# Patient Record
Sex: Female | Born: 1960 | Race: White | Hispanic: No | Smoking: Never smoker
Health system: Southern US, Community
[De-identification: ages and names within clinical notes are randomized; demographics above are authoritative.]

## PROBLEM LIST (undated history)

## (undated) DIAGNOSIS — I10 Essential (primary) hypertension: Secondary | ICD-10-CM

## (undated) DIAGNOSIS — M069 Rheumatoid arthritis, unspecified: Secondary | ICD-10-CM

## (undated) HISTORY — PX: ABDOMINAL HYSTERECTOMY: SHX81

## (undated) HISTORY — PX: CHOLECYSTECTOMY: SHX55

## (undated) HISTORY — PX: TONSILLECTOMY: SUR1361

---

## 2017-09-04 ENCOUNTER — Other Ambulatory Visit: Payer: Self-pay | Admitting: Rheumatology

## 2017-09-04 DIAGNOSIS — J849 Interstitial pulmonary disease, unspecified: Secondary | ICD-10-CM

## 2017-09-23 ENCOUNTER — Ambulatory Visit
Admission: RE | Admit: 2017-09-23 | Discharge: 2017-09-23 | Disposition: A | Discharge status: Home / Self Care | Payer: Self-pay | Source: Ambulatory Visit | Attending: Rheumatology | Admitting: Rheumatology

## 2017-09-23 DIAGNOSIS — J849 Interstitial pulmonary disease, unspecified: Secondary | ICD-10-CM

## 2017-09-30 ENCOUNTER — Other Ambulatory Visit (HOSPITAL_COMMUNITY): Payer: Self-pay | Admitting: Respiratory Therapy

## 2017-09-30 DIAGNOSIS — J849 Interstitial pulmonary disease, unspecified: Secondary | ICD-10-CM

## 2017-10-09 ENCOUNTER — Ambulatory Visit (HOSPITAL_COMMUNITY)
Admission: RE | Admit: 2017-10-09 | Discharge: 2017-10-09 | Disposition: A | Discharge status: Home / Self Care | Payer: BLUE CROSS/BLUE SHIELD | Source: Ambulatory Visit | Attending: Rheumatology | Admitting: Rheumatology

## 2017-10-09 DIAGNOSIS — J849 Interstitial pulmonary disease, unspecified: Secondary | ICD-10-CM | POA: Diagnosis present

## 2017-10-09 DIAGNOSIS — J984 Other disorders of lung: Secondary | ICD-10-CM | POA: Insufficient documentation

## 2017-10-09 LAB — PULMONARY FUNCTION TEST
DL/VA % pred: 84 %
DL/VA: 4.14 ml/min/mmHg/L
DLCO unc % pred: 71 %
DLCO unc: 18.39 ml/min/mmHg
FEF 25-75 PRE: 3.14 L/s
FEF2575-%PRED-PRE: 123 %
FEV1-%PRED-PRE: 95 %
FEV1-PRE: 2.61 L
FEV1FVC-%Pred-Pre: 105 %
FEV6-%PRED-PRE: 91 %
FEV6-Pre: 3.11 L
FEV6FVC-%Pred-Pre: 102 %
FVC-%Pred-Pre: 89 %
FVC-Pre: 3.13 L
Pre FEV1/FVC ratio: 83 %
Pre FEV6/FVC Ratio: 99 %
RV % PRED: 93 %
RV: 1.84 L
TLC % pred: 97 %
TLC: 5.09 L

## 2018-03-19 ENCOUNTER — Inpatient Hospital Stay (HOSPITAL_COMMUNITY)
Admission: EM | Admit: 2018-03-19 | Discharge: 2018-03-21 | DRG: 683 | Disposition: A | Discharge status: Home / Self Care | Payer: BLUE CROSS/BLUE SHIELD | Attending: Family Medicine | Admitting: Family Medicine

## 2018-03-19 ENCOUNTER — Encounter (HOSPITAL_COMMUNITY): Payer: Self-pay | Admitting: Emergency Medicine

## 2018-03-19 DIAGNOSIS — Z791 Long term (current) use of non-steroidal anti-inflammatories (NSAID): Secondary | ICD-10-CM

## 2018-03-19 DIAGNOSIS — E876 Hypokalemia: Secondary | ICD-10-CM

## 2018-03-19 DIAGNOSIS — R112 Nausea with vomiting, unspecified: Secondary | ICD-10-CM | POA: Diagnosis present

## 2018-03-19 DIAGNOSIS — Z79899 Other long term (current) drug therapy: Secondary | ICD-10-CM

## 2018-03-19 DIAGNOSIS — N179 Acute kidney failure, unspecified: Secondary | ICD-10-CM | POA: Diagnosis not present

## 2018-03-19 DIAGNOSIS — M069 Rheumatoid arthritis, unspecified: Secondary | ICD-10-CM | POA: Diagnosis present

## 2018-03-19 DIAGNOSIS — R1013 Epigastric pain: Secondary | ICD-10-CM

## 2018-03-19 DIAGNOSIS — Z9049 Acquired absence of other specified parts of digestive tract: Secondary | ICD-10-CM

## 2018-03-19 DIAGNOSIS — I10 Essential (primary) hypertension: Secondary | ICD-10-CM | POA: Diagnosis present

## 2018-03-19 DIAGNOSIS — R197 Diarrhea, unspecified: Secondary | ICD-10-CM | POA: Diagnosis present

## 2018-03-19 DIAGNOSIS — E872 Acidosis: Secondary | ICD-10-CM | POA: Diagnosis present

## 2018-03-19 DIAGNOSIS — Z7952 Long term (current) use of systemic steroids: Secondary | ICD-10-CM

## 2018-03-19 DIAGNOSIS — D696 Thrombocytopenia, unspecified: Secondary | ICD-10-CM

## 2018-03-19 DIAGNOSIS — J111 Influenza due to unidentified influenza virus with other respiratory manifestations: Secondary | ICD-10-CM

## 2018-03-19 DIAGNOSIS — Z90710 Acquired absence of both cervix and uterus: Secondary | ICD-10-CM

## 2018-03-19 DIAGNOSIS — D649 Anemia, unspecified: Secondary | ICD-10-CM | POA: Diagnosis present

## 2018-03-19 DIAGNOSIS — E871 Hypo-osmolality and hyponatremia: Secondary | ICD-10-CM | POA: Diagnosis present

## 2018-03-19 HISTORY — DX: Essential (primary) hypertension: I10

## 2018-03-19 HISTORY — DX: Rheumatoid arthritis, unspecified: M06.9

## 2018-03-19 MED ORDER — ONDANSETRON 4 MG PO TBDP
4.0000 mg | ORAL_TABLET | Freq: Once | ORAL | Status: AC
  Administered 2018-03-19: 4 mg via ORAL
  Filled 2018-03-19: qty 1

## 2018-03-19 NOTE — ED Triage Notes (Signed)
Pt reports she was dx with the flu X2 days ago and was started on Tamiflu. Pt continues to have N/V/D

## 2018-03-20 ENCOUNTER — Encounter (HOSPITAL_COMMUNITY): Payer: Self-pay | Admitting: *Deleted

## 2018-03-20 ENCOUNTER — Emergency Department (HOSPITAL_COMMUNITY): Payer: BLUE CROSS/BLUE SHIELD

## 2018-03-20 ENCOUNTER — Other Ambulatory Visit: Payer: Self-pay

## 2018-03-20 DIAGNOSIS — N179 Acute kidney failure, unspecified: Secondary | ICD-10-CM | POA: Diagnosis present

## 2018-03-20 DIAGNOSIS — R112 Nausea with vomiting, unspecified: Secondary | ICD-10-CM | POA: Diagnosis present

## 2018-03-20 DIAGNOSIS — R197 Diarrhea, unspecified: Secondary | ICD-10-CM

## 2018-03-20 DIAGNOSIS — D649 Anemia, unspecified: Secondary | ICD-10-CM | POA: Diagnosis present

## 2018-03-20 DIAGNOSIS — E872 Acidosis: Secondary | ICD-10-CM | POA: Diagnosis present

## 2018-03-20 DIAGNOSIS — J111 Influenza due to unidentified influenza virus with other respiratory manifestations: Secondary | ICD-10-CM | POA: Diagnosis present

## 2018-03-20 DIAGNOSIS — Z9049 Acquired absence of other specified parts of digestive tract: Secondary | ICD-10-CM | POA: Diagnosis not present

## 2018-03-20 DIAGNOSIS — Z79899 Other long term (current) drug therapy: Secondary | ICD-10-CM | POA: Diagnosis not present

## 2018-03-20 DIAGNOSIS — D696 Thrombocytopenia, unspecified: Secondary | ICD-10-CM | POA: Diagnosis present

## 2018-03-20 DIAGNOSIS — Z791 Long term (current) use of non-steroidal anti-inflammatories (NSAID): Secondary | ICD-10-CM | POA: Diagnosis not present

## 2018-03-20 DIAGNOSIS — Z7952 Long term (current) use of systemic steroids: Secondary | ICD-10-CM | POA: Diagnosis not present

## 2018-03-20 DIAGNOSIS — E871 Hypo-osmolality and hyponatremia: Secondary | ICD-10-CM | POA: Diagnosis present

## 2018-03-20 DIAGNOSIS — I10 Essential (primary) hypertension: Secondary | ICD-10-CM | POA: Diagnosis present

## 2018-03-20 DIAGNOSIS — Z90710 Acquired absence of both cervix and uterus: Secondary | ICD-10-CM | POA: Diagnosis not present

## 2018-03-20 DIAGNOSIS — M069 Rheumatoid arthritis, unspecified: Secondary | ICD-10-CM | POA: Diagnosis present

## 2018-03-20 DIAGNOSIS — E876 Hypokalemia: Secondary | ICD-10-CM | POA: Diagnosis present

## 2018-03-20 LAB — CBC
HCT: 38.7 % (ref 36.0–46.0)
HEMATOCRIT: 30.9 % — AB (ref 36.0–46.0)
Hemoglobin: 12.9 g/dL (ref 12.0–15.0)
Hemoglobin: 10.3 g/dL — ABNORMAL LOW (ref 12.0–15.0)
MCH: 29.4 pg (ref 26.0–34.0)
MCH: 29.8 pg (ref 26.0–34.0)
MCHC: 33.3 g/dL (ref 30.0–36.0)
MCHC: 33.3 g/dL (ref 30.0–36.0)
MCV: 88.2 fL (ref 80.0–100.0)
MCV: 89.3 fL (ref 80.0–100.0)
NRBC: 0 % (ref 0.0–0.2)
NRBC: 0 % (ref 0.0–0.2)
PLATELETS: 223 10*3/uL (ref 150–400)
PLATELETS: 143 10*3/uL — AB (ref 150–400)
RBC: 4.39 MIL/uL (ref 3.87–5.11)
RBC: 3.46 MIL/uL — ABNORMAL LOW (ref 3.87–5.11)
RDW: 12.4 % (ref 11.5–15.5)
RDW: 12.5 % (ref 11.5–15.5)
WBC: 6.6 10*3/uL (ref 4.0–10.5)
WBC: 5.2 10*3/uL (ref 4.0–10.5)

## 2018-03-20 LAB — URINALYSIS, ROUTINE W REFLEX MICROSCOPIC
BILIRUBIN URINE: NEGATIVE
Glucose, UA: NEGATIVE mg/dL
KETONES UR: 5 mg/dL — AB
LEUKOCYTES UA: NEGATIVE
Nitrite: NEGATIVE
PH: 5 (ref 5.0–8.0)
PROTEIN: NEGATIVE mg/dL
Specific Gravity, Urine: 1.004 — ABNORMAL LOW (ref 1.005–1.030)

## 2018-03-20 LAB — BASIC METABOLIC PANEL
ANION GAP: 7 (ref 5–15)
Anion gap: 11 (ref 5–15)
BUN: 35 mg/dL — ABNORMAL HIGH (ref 6–20)
BUN: 26 mg/dL — ABNORMAL HIGH (ref 6–20)
CALCIUM: 8 mg/dL — AB (ref 8.9–10.3)
CO2: 17 mmol/L — ABNORMAL LOW (ref 22–32)
CO2: 21 mmol/L — ABNORMAL LOW (ref 22–32)
CREATININE: 2.08 mg/dL — AB (ref 0.44–1.00)
Calcium: 7.2 mg/dL — ABNORMAL LOW (ref 8.9–10.3)
Chloride: 106 mmol/L (ref 98–111)
Chloride: 112 mmol/L — ABNORMAL HIGH (ref 98–111)
Creatinine, Ser: 3.52 mg/dL — ABNORMAL HIGH (ref 0.44–1.00)
GFR calc Af Amer: 16 mL/min — ABNORMAL LOW (ref >60)
GFR calc non Af Amer: 13 mL/min — ABNORMAL LOW (ref >60)
GFR, EST AFRICAN AMERICAN: 29 mL/min — AB (ref >60)
GFR, EST NON AFRICAN AMERICAN: 25 mL/min — AB (ref >60)
Glucose, Bld: 82 mg/dL (ref 70–99)
Glucose, Bld: 129 mg/dL — ABNORMAL HIGH (ref 70–99)
Potassium: 3.1 mmol/L — ABNORMAL LOW (ref 3.5–5.1)
Potassium: 3.2 mmol/L — ABNORMAL LOW (ref 3.5–5.1)
Sodium: 134 mmol/L — ABNORMAL LOW (ref 135–145)
Sodium: 140 mmol/L (ref 135–145)

## 2018-03-20 LAB — I-STAT BETA HCG BLOOD, ED (MC, WL, AP ONLY): I-stat hCG, quantitative: 9.4 m[IU]/mL — ABNORMAL HIGH (ref <5)

## 2018-03-20 LAB — COMPREHENSIVE METABOLIC PANEL
ALBUMIN: 3.9 g/dL (ref 3.5–5.0)
ALT: 27 U/L (ref 0–44)
AST: 23 U/L (ref 15–41)
Alkaline Phosphatase: 42 U/L (ref 38–126)
Anion gap: 16 — ABNORMAL HIGH (ref 5–15)
BILIRUBIN TOTAL: 0.8 mg/dL (ref 0.3–1.2)
BUN: 39 mg/dL — AB (ref 6–20)
CO2: 19 mmol/L — ABNORMAL LOW (ref 22–32)
CREATININE: 3.92 mg/dL — AB (ref 0.44–1.00)
Calcium: 8.6 mg/dL — ABNORMAL LOW (ref 8.9–10.3)
Chloride: 97 mmol/L — ABNORMAL LOW (ref 98–111)
GFR calc Af Amer: 14 mL/min — ABNORMAL LOW (ref >60)
GFR calc non Af Amer: 12 mL/min — ABNORMAL LOW (ref >60)
GLUCOSE: 97 mg/dL (ref 70–99)
POTASSIUM: 2.8 mmol/L — AB (ref 3.5–5.1)
Sodium: 132 mmol/L — ABNORMAL LOW (ref 135–145)
TOTAL PROTEIN: 6.8 g/dL (ref 6.5–8.1)

## 2018-03-20 LAB — HIV ANTIBODY (ROUTINE TESTING W REFLEX): HIV Screen 4th Generation wRfx: NONREACTIVE

## 2018-03-20 LAB — MAGNESIUM: MAGNESIUM: 1.7 mg/dL (ref 1.7–2.4)

## 2018-03-20 LAB — LIPASE, BLOOD: Lipase: 38 U/L (ref 11–51)

## 2018-03-20 MED ORDER — SODIUM CHLORIDE 0.9 % IV BOLUS
1000.0000 mL | Freq: Once | INTRAVENOUS | Status: AC
  Administered 2018-03-20: 1000 mL via INTRAVENOUS

## 2018-03-20 MED ORDER — ACETAMINOPHEN 500 MG PO TABS
1000.0000 mg | ORAL_TABLET | Freq: Once | ORAL | Status: AC
  Administered 2018-03-20: 1000 mg via ORAL
  Filled 2018-03-20: qty 2

## 2018-03-20 MED ORDER — SODIUM CHLORIDE 0.45 % IV SOLN
INTRAVENOUS | Status: DC
  Administered 2018-03-20 (×2): via INTRAVENOUS
  Filled 2018-03-20 (×5): qty 75

## 2018-03-20 MED ORDER — FENTANYL CITRATE (PF) 100 MCG/2ML IJ SOLN
50.0000 ug | Freq: Once | INTRAMUSCULAR | Status: AC
  Administered 2018-03-20: 50 ug via INTRAVENOUS
  Filled 2018-03-20: qty 2

## 2018-03-20 MED ORDER — ONDANSETRON HCL 4 MG PO TABS: 4.0000 mg | ORAL_TABLET | Freq: Four times a day (QID) | ORAL | Status: DC | PRN

## 2018-03-20 MED ORDER — POTASSIUM CHLORIDE 10 MEQ/100ML IV SOLN
10.0000 meq | Freq: Once | INTRAVENOUS | Status: AC
  Administered 2018-03-20: 10 meq via INTRAVENOUS
  Filled 2018-03-20: qty 100

## 2018-03-20 MED ORDER — POTASSIUM CHLORIDE CRYS ER 20 MEQ PO TBCR: 40.0000 meq | EXTENDED_RELEASE_TABLET | Freq: Once | ORAL | Status: DC

## 2018-03-20 MED ORDER — PROMETHAZINE HCL 25 MG PO TABS: 12.5000 mg | ORAL_TABLET | Freq: Four times a day (QID) | ORAL | Status: DC | PRN

## 2018-03-20 MED ORDER — OSELTAMIVIR PHOSPHATE 30 MG PO CAPS
30.0000 mg | ORAL_CAPSULE | Freq: Every day | ORAL | Status: DC
  Administered 2018-03-20 – 2018-03-21 (×2): 30 mg via ORAL
  Filled 2018-03-20 (×2): qty 1

## 2018-03-20 MED ORDER — ACETAMINOPHEN 325 MG PO TABS
650.0000 mg | ORAL_TABLET | Freq: Four times a day (QID) | ORAL | Status: DC | PRN
  Administered 2018-03-21: 650 mg via ORAL
  Filled 2018-03-20: qty 2

## 2018-03-20 MED ORDER — FOLIC ACID 1 MG PO TABS
1.0000 mg | ORAL_TABLET | Freq: Every day | ORAL | Status: DC
  Administered 2018-03-20 – 2018-03-21 (×2): 1 mg via ORAL
  Filled 2018-03-20 (×2): qty 1

## 2018-03-20 MED ORDER — HEPARIN SODIUM (PORCINE) 5000 UNIT/ML IJ SOLN: 5000.0000 [IU] | Freq: Three times a day (TID) | INTRAMUSCULAR | Status: DC

## 2018-03-20 MED ORDER — POTASSIUM CHLORIDE CRYS ER 20 MEQ PO TBCR: 20.0000 meq | EXTENDED_RELEASE_TABLET | Freq: Every day | ORAL | Status: DC

## 2018-03-20 MED ORDER — POTASSIUM CHLORIDE CRYS ER 20 MEQ PO TBCR
20.0000 meq | EXTENDED_RELEASE_TABLET | Freq: Once | ORAL | Status: AC
  Administered 2018-03-20: 20 meq via ORAL

## 2018-03-20 MED ORDER — POTASSIUM CHLORIDE CRYS ER 20 MEQ PO TBCR
30.0000 meq | EXTENDED_RELEASE_TABLET | Freq: Once | ORAL | Status: AC
  Administered 2018-03-20: 30 meq via ORAL
  Filled 2018-03-20: qty 1

## 2018-03-20 MED ORDER — ONDANSETRON HCL 4 MG/2ML IJ SOLN: 4.0000 mg | Freq: Four times a day (QID) | INTRAMUSCULAR | Status: DC | PRN

## 2018-03-20 MED ORDER — ACETAMINOPHEN 650 MG RE SUPP: 650.0000 mg | Freq: Four times a day (QID) | RECTAL | Status: DC | PRN

## 2018-03-20 MED ORDER — POTASSIUM CHLORIDE CRYS ER 20 MEQ PO TBCR
40.0000 meq | EXTENDED_RELEASE_TABLET | Freq: Once | ORAL | Status: AC
  Administered 2018-03-20: 40 meq via ORAL
  Filled 2018-03-20: qty 2

## 2018-03-20 MED ORDER — ONDANSETRON HCL 4 MG/2ML IJ SOLN
4.0000 mg | Freq: Once | INTRAMUSCULAR | Status: AC
  Administered 2018-03-20: 4 mg via INTRAVENOUS
  Filled 2018-03-20: qty 2

## 2018-03-20 MED ORDER — POTASSIUM CHLORIDE CRYS ER 20 MEQ PO TBCR
20.0000 meq | EXTENDED_RELEASE_TABLET | Freq: Once | ORAL | Status: AC
  Administered 2018-03-20: 20 meq via ORAL
  Filled 2018-03-20: qty 1

## 2018-03-20 MED ORDER — PREDNISONE 5 MG PO TABS
15.0000 mg | ORAL_TABLET | Freq: Every day | ORAL | Status: DC
  Administered 2018-03-20 – 2018-03-21 (×2): 15 mg via ORAL
  Filled 2018-03-20 (×2): qty 1

## 2018-03-20 MED ORDER — SODIUM CHLORIDE 0.9 % IV SOLN
INTRAVENOUS | Status: DC
  Administered 2018-03-20: 03:00:00 via INTRAVENOUS

## 2018-03-20 MED ORDER — POTASSIUM CHLORIDE CRYS ER 10 MEQ PO TBCR
EXTENDED_RELEASE_TABLET | ORAL | Status: AC
  Filled 2018-03-20: qty 1

## 2018-03-20 NOTE — ED Provider Notes (Signed)
[facility] EMERGENCY DEPARTMENT Provider Note  CSN: [id number] Arrival date & time: 03/19/18 2247  Chief Complaint(s) Influenza  HPI Madison Barajas is a 57 y.o. female recently diagnosed with influenza currently  The history is provided by the patient.  Influenza  Presenting symptoms: diarrhea, fatigue, fever, headache (intermittent, frontal), myalgias, nausea and vomiting   Presenting symptoms: no cough, no rhinorrhea, no shortness of breath and no sore throat   Severity:  Moderate Onset quality:  Gradual Duration:  5 days Progression:  Waxing and waning Chronicity:  New Relieved by:  Nothing Worsened by:  Nothing Ineffective treatments:  OTC medications (and Zofran or Tamiflu) Associated symptoms: chills   Risk factors: immunocompromised state (h/o RA on MTX and prednisone)   Risk factors: no kidney disease and no liver disease     Past Medical History Past Medical History:  Diagnosis Date  . Hypertension   . Rheumatoid arthritis [facility])    Patient Active Problem List   Diagnosis Date Noted  . Acute kidney failure (HCC) 03/20/2018   Home Medication(s) Prior to Admission medications   Medication Sig Start Date End Date Taking? Authorizing Provider  eszopiclone (LUNESTA) 2 MG TABS tablet Take 2 mg by mouth at bedtime.  02/20/18  Yes [provider]  folic acid (FOLVITE) 1 MG tablet Take 1 mg by mouth daily. 02/19/18  Yes [provider]  ibuprofen (ADVIL,MOTRIN) 800 MG tablet Take 800 mg by mouth every 8 (eight) hours as needed for mild pain.  03/18/18  Yes [provider]  methotrexate 2.5 MG tablet Take 15 mg by mouth once a week. On Friday 02/26/18  Yes [provider]  ondansetron (ZOFRAN) 8 MG tablet Take 8 mg by mouth every 8 (eight) hours as needed for nausea. for nausea 03/18/18  Yes [provider]  oseltamivir (TAMIFLU) 75 MG capsule Take 75 mg by mouth daily. 03/18/18  Yes [provider]   predniSONE (DELTASONE) 5 MG tablet Take 5 mg by mouth daily. 02/23/18  Yes [provider]  telmisartan-hydrochlorothiazide (MICARDIS HCT) 40-12.5 MG tablet Take 1 tablet by mouth daily. 02/15/18  Yes [provider]                                                                                                                                    Past Surgical History Past Surgical History:  Procedure Laterality Date  . ABDOMINAL HYSTERECTOMY    . CHOLECYSTECTOMY    . TONSILLECTOMY     Family History No family history on file.  Social History Social History   Tobacco Use  . Smoking status: Never Smoker  . Smokeless tobacco: Never Used  Substance Use Topics  . Alcohol use: Not on file  . Drug use: Not Currently   Allergies Patient has no known allergies.  Review of Systems Review of Systems  Constitutional: Positive for chills, fatigue and fever.  HENT: Negative  for rhinorrhea and sore throat.   Respiratory: Negative for cough and shortness of breath.   Gastrointestinal: Positive for diarrhea, nausea and vomiting.  Musculoskeletal: Positive for myalgias.  Neurological: Positive for headaches (intermittent, frontal).   All other systems are reviewed and are negative for acute change except as noted in the HPI  Physical Exam Vital Signs  I have reviewed the triage vital signs BP 102/60 (BP Location: Left Arm)   Pulse 65   Temp 98 F (36.7 C) (Oral)   Resp 18   Ht 5\' 5"  (1.651 m)   Wt 63.5 kg   SpO2 99%   BMI 23.30 kg/m   Physical Exam  Constitutional: She is oriented to person, place, and time. She appears well-developed and well-nourished. She has a sickly appearance. No distress.  HENT:  Head: Normocephalic and atraumatic.  Nose: Nose normal.  Eyes: Pupils are equal, round, and reactive to light. Conjunctivae and EOM are normal. Right eye exhibits no discharge. Left eye exhibits no discharge. No scleral icterus.  Neck: Normal range of motion.  Neck supple.  Cardiovascular: Normal rate and regular rhythm. Exam reveals no gallop and no friction rub.  No murmur heard. Pulmonary/Chest: Effort normal and breath sounds normal. No stridor. No respiratory distress. She has no rales.  Abdominal: Soft. She exhibits no distension. There is tenderness in the epigastric area. There is no rigidity, no rebound, no guarding and no CVA tenderness.  Musculoskeletal: She exhibits no edema or tenderness.  Neurological: She is alert and oriented to person, place, and time.  Skin: Skin is warm and dry. No rash noted. She is not diaphoretic. No erythema.  Psychiatric: She has a normal mood and affect.  Vitals reviewed.   ED Results and Treatments Labs (all labs ordered are listed, but only abnormal results are displayed) Labs Reviewed  COMPREHENSIVE METABOLIC PANEL - Abnormal; Notable for the following components:      Result Value   Sodium 132 (*)    Potassium 2.8 (*)    Chloride 97 (*)    CO2 19 (*)    BUN 39 (*)    Creatinine, Ser 3.92 (*)    Calcium 8.6 (*)    GFR calc non Af Amer 12 (*)    GFR calc Af Amer 14 (*)    Anion gap 16 (*)    All other components within normal limits  I-STAT BETA HCG BLOOD, ED ([facility], [facility], [facility] ONLY) - Abnormal; Notable for the following components:   I-stat hCG, quantitative 9.4 (*)    All other components within normal limits  LIPASE, BLOOD  CBC  URINALYSIS, ROUTINE W REFLEX MICROSCOPIC  HIV ANTIBODY (ROUTINE TESTING W REFLEX)  CBC  BASIC METABOLIC PANEL                                                                                                                         EKG  EKG Interpretation  Date/Time:  Friday March 20 2018 01:46:17 EST Ventricular  Rate:  67 PR Interval:    QRS Duration: 116 QT Interval:  442 QTC Calculation: 467 R Axis:   68 Text Interpretation:  Sinus rhythm Nonspecific intraventricular conduction delay Low voltage, precordial leads NO STEMI No old tracing to compare  Confirmed by Drema Pry [phone]) on 03/20/2018 1:51:11 AM      Radiology Dg Chest 2 View  Result Date: 03/20/2018 CLINICAL DATA:  Diagnosed with flu 2 days ago. Nausea, vomiting, diarrhea, and body aches. History of hypertension. EXAM: CHEST - 2 VIEW COMPARISON:  August 29, 2016 FINDINGS: The heart size and mediastinal contours are within normal limits. Both lungs are clear. The visualized skeletal structures are unremarkable. IMPRESSION: No active cardiopulmonary disease. Electronically Signed   By: Gerome Sam III M.D   On: 03/20/2018 01:44   Pertinent labs & imaging results that were available during my care of the patient were reviewed by me and considered in my medical decision making (see chart for details).  Medications Ordered in ED Medications  potassium chloride 10 mEq in 100 mL IVPB (10 mEq Intravenous New Bag/Given 03/20/18 0150)  acetaminophen (TYLENOL) tablet 650 mg (has no administration in time range)    Or  acetaminophen (TYLENOL) suppository 650 mg (has no administration in time range)  ondansetron (ZOFRAN) tablet 4 mg (has no administration in time range)    Or  ondansetron (ZOFRAN) injection 4 mg (has no administration in time range)  predniSONE (DELTASONE) tablet 15 mg (has no administration in time range)  heparin injection 5,000 Units (has no administration in time range)  0.9 %  sodium chloride infusion (has no administration in time range)  promethazine (PHENERGAN) tablet 12.5 mg (has no administration in time range)  ondansetron (ZOFRAN-ODT) disintegrating tablet 4 mg (4 mg Oral Given 03/19/18 2325)  acetaminophen (TYLENOL) tablet 1,000 mg (1,000 mg Oral Given 03/20/18 0149)  sodium chloride 0.9 % bolus 1,000 mL (0 mLs Intravenous Stopped 03/20/18 0215)  ondansetron (ZOFRAN) injection 4 mg (4 mg Intravenous Given 03/20/18 0149)  fentaNYL (SUBLIMAZE) injection 50 mcg (50 mcg Intravenous Given 03/20/18 0148)                                                                                                                                     Procedures Procedures CRITICAL CARE Performed by: Amadeo Garnet Finola Rosal Total critical care time: 35 minutes Critical care time was exclusive of separately billable procedures and treating other patients. Critical care was necessary to treat or prevent imminent or life-threatening deterioration. Critical care was time spent personally by me on the following activities: development of treatment plan with patient and/or surrogate as well as nursing, discussions with consultants, evaluation of patient's response to treatment, examination of patient, obtaining history from patient or surrogate, ordering and performing treatments and interventions, ordering and review of laboratory studies, ordering and review of radiographic studies, pulse oximetry and re-evaluation of patient's condition.   (including critical care  time)  Medical Decision Making / ED Course I have reviewed the nursing notes for this encounter and the patient's prior records (if available in EHR or on provided paperwork).    Patient here for inability to tolerate oral intake due to influenza symptoms.  Work-up notable for AKI likely due to dehydration and medication.  With hypo-kalemia likely due to diarrhea.  Patient having epigastric abdominal pain and discomfort with no evidence of biliary obstruction or pancreatitis.  Likely due to gastric etiology from emesis.  No leukocytosis.  Doubt cardiac etiology.  Patient provided with symptomatic treatment including several IV fluid boluses, nausea medicine, Tylenol and IV pain medicine.  Admitted to medicine for continued management and hydration.  Final Clinical Impression(s) / ED Diagnoses Final diagnoses:  Epigastric abdominal pain  Influenza  AKI (acute kidney injury) (HCC)  Hypokalemia      This chart was dictated using voice recognition software.  Despite best efforts to proofread,  errors  can occur which can change the documentation meaning.   Nira Conn, MD 03/20/18 [phone]

## 2018-03-20 NOTE — H&P (Signed)
History and Physical    Madison Barajas GNF:[id number] DOB: [birth year] DOA: 03/19/2018  PCP: Renaye Rakers, MD  Patient coming from: Home  I have personally briefly reviewed patient's old medical records in [facility]  Chief Complaint: Influenza  HPI: Madison Barajas is a 57 y.o. female with medical history significant of RA, HTN.  Patient presents to the ED with c/o 5 days of ongoing N/V/D, fatigue, fever, headache, myalgias.  Diagnosed with influenza virus a couple of days ago and started on Tamiflu.  Symptoms have persisted.   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   Review of Systems: As per HPI otherwise 10 point review of systems negative.   Past Medical History:  Diagnosis Date  . Hypertension   . Rheumatoid arthritis [facility])     Past Surgical History:  Procedure Laterality Date  . ABDOMINAL HYSTERECTOMY    . CHOLECYSTECTOMY    . TONSILLECTOMY       reports that she has never smoked. She has never used smokeless tobacco. She reports that she has current or past drug history. Her alcohol history is not on file.  No Known Allergies  No family history on file.   Prior to Admission medications   Medication Sig Start Date End Date Taking? Authorizing Provider  eszopiclone (LUNESTA) 2 MG TABS tablet Take 2 mg by mouth at bedtime.  02/20/18  Yes [provider]  folic acid (FOLVITE) 1 MG tablet Take 1 mg by mouth daily. 02/19/18  Yes [provider]  ibuprofen (ADVIL,MOTRIN) 800 MG tablet Take 800 mg by mouth every 8 (eight) hours as needed for mild pain.  03/18/18  Yes [provider]  methotrexate 2.5 MG tablet Take 15 mg by mouth once a week. On Friday 02/26/18  Yes [provider]  ondansetron (ZOFRAN) 8 MG tablet Take 8 mg by mouth every 8 (eight) hours as needed for nausea. for nausea 03/18/18  Yes [provider]  oseltamivir (TAMIFLU) 75 MG capsule Take 75 mg by mouth daily. 03/18/18  Yes  [provider]  predniSONE (DELTASONE) 5 MG tablet Take 5 mg by mouth daily. 02/23/18  Yes [provider]  telmisartan-hydrochlorothiazide (MICARDIS HCT) 40-12.5 MG tablet Take 1 tablet by mouth daily. 02/15/18  Yes [provider]    Physical Exam: Vitals:   03/20/18 0104 03/20/18 0156 03/20/18 0200 03/20/18 0215  BP: 102/60 (!) 106/48 (!) 100/52 (!) 101/59  Pulse: 65 85 76 77  Resp: 18 16 12    Temp: 98 F (36.7 C)     TempSrc: Oral     SpO2: 99% 98% 99% 99%  Weight:      Height:        Constitutional: NAD, calm, comfortable, ill appearing Eyes: PERRL, lids and conjunctivae normal ENMT: Mucous membranes are moist. Posterior pharynx clear of any exudate or lesions.Normal dentition.  Neck: normal, supple, no masses, no thyromegaly Respiratory: clear to auscultation bilaterally, no wheezing, no crackles. Normal respiratory effort. No accessory muscle use.  Cardiovascular: Regular rate and rhythm, no murmurs / rubs / gallops. No extremity edema. 2+ pedal pulses. No carotid bruits.  Abdomen: Epigastric TTP Musculoskeletal: no clubbing / cyanosis. No joint deformity upper and lower extremities. Good ROM, no contractures. Normal muscle tone.  Skin: no rashes, lesions, ulcers. No induration Neurologic: CN 2-12 grossly intact. Sensation intact, DTR normal. Strength 5/5 in all 4.  Psychiatric: Normal judgment and insight. Alert and oriented x 3. Normal  mood.    Labs on Admission: I have personally reviewed following labs and imaging studies  CBC: Recent Labs  Lab 03/19/18 2326  WBC 6.6  HGB 12.9  HCT 38.7  MCV 88.2  PLT 223   Basic Metabolic Panel: Recent Labs  Lab 03/19/18 2326  NA 132*  K 2.8*  CL 97*  CO2 19*  GLUCOSE 97  BUN 39*  CREATININE 3.92*  CALCIUM 8.6*   GFR: Estimated Creatinine Clearance: 14.2 mL/min (A) (by C-G formula based on SCr of 3.92 mg/dL (H)). Liver Function Tests: Recent Labs  Lab 03/19/18 2326  AST 23  ALT 27   ALKPHOS 42  BILITOT 0.8  PROT 6.8  ALBUMIN 3.9   Recent Labs  Lab 03/19/18 2326  LIPASE 38   No results for input(s): AMMONIA in the last 168 hours. Coagulation Profile: No results for input(s): INR, PROTIME in the last 168 hours. Cardiac Enzymes: No results for input(s): CKTOTAL, CKMB, CKMBINDEX, TROPONINI in the last 168 hours. BNP (last 3 results) No results for input(s): PROBNP in the last 8760 hours. HbA1C: No results for input(s): HGBA1C in the last 72 hours. CBG: No results for input(s): GLUCAP in the last 168 hours. Lipid Profile: No results for input(s): CHOL, HDL, LDLCALC, TRIG, CHOLHDL, LDLDIRECT in the last 72 hours. Thyroid Function Tests: No results for input(s): TSH, T4TOTAL, FREET4, T3FREE, THYROIDAB in the last 72 hours. Anemia Panel: No results for input(s): VITAMINB12, FOLATE, FERRITIN, TIBC, IRON, RETICCTPCT in the last 72 hours. Urine analysis: No results found for: COLORURINE, APPEARANCEUR, LABSPEC, PHURINE, GLUCOSEU, HGBUR, BILIRUBINUR, KETONESUR, PROTEINUR, UROBILINOGEN, NITRITE, LEUKOCYTESUR  Radiological Exams on Admission: Dg Chest 2 View  Result Date: 03/20/2018 CLINICAL DATA:  Diagnosed with flu 2 days ago. Nausea, vomiting, diarrhea, and body aches. History of hypertension. EXAM: CHEST - 2 VIEW COMPARISON:  August 29, 2016 FINDINGS: The heart size and mediastinal contours are within normal limits. Both lungs are clear. The visualized skeletal structures are unremarkable. IMPRESSION: No active cardiopulmonary disease. Electronically Signed   By: Gerome Sam III M.D   On: 03/20/2018 01:44    EKG: Independently reviewed.  Assessment/Plan Principal Problem:   Acute kidney failure (HCC) Active Problems:   RA (rheumatoid arthritis) (HCC)   Influenza   Nausea vomiting and diarrhea    1. AKF - 1. UA ordered and pending 2. Suspect pre-renal 3. Strict intake and output 4. Follow BMP 5. IVF: ns AT 100 cc/hr 1L bolus in ED. 6. Avoid  NSAIDS 7. Holding HCTZ - lisinopril 2. RA - 1. Holding off on MTX during acute illness 2. Trippling up on Prednisone dose during acute illness so 15mg  PO daily with first dose now ordered 3. Not hypotensive to suggest need for full stress dose at this point. 3. Influenza - cont tamiflu 4. N/V/D - 1. Zofran PRN 2. Phenergan PRN break through  DVT prophylaxis: Heparin [place] Code Status: Full Family Communication: No family in room Disposition Plan: Home after admit Consults called: None Admission status: Admit to inpatient  Severity of Illness: The appropriate patient status for this patient is INPATIENT. Inpatient status is judged to be reasonable and necessary in order to provide the required intensity of service to ensure the patient's safety. The patient's presenting symptoms, physical exam findings, and initial radiographic and laboratory data in the context of their chronic comorbidities is felt to place them at high risk for further clinical deterioration. Furthermore, it is not anticipated that the patient will be medically stable for discharge  from the hospital within 2 midnights of admission. The following factors support the patient status of inpatient.   " The patient's presenting symptoms include Influenza, N/V/D, fever, chills. 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 " The chronic co-morbidities include RA on MTX and prednisone.   * I certify that at the point of admission it is my clinical judgment that the patient will require inpatient hospital care spanning beyond 2 midnights from the point of admission due to high intensity of service, high risk for further deterioration and high frequency of surveillance required.Hillary Bow DO [facility] Pager [phone] Only works nights!  If 7AM-7PM, please contact the primary day team physician taking care of  patient  [email or web address] Password [email or web address]  03/20/2018, 2:41 AM

## 2018-03-20 NOTE — Progress Notes (Signed)
  Patient was admitted this morning, reviewed H&P and admission notes and agree with admission plan with following addendum.  The  Please review  Admission note for more details.  Briefly 57 year old female with history of RA on methotrexate/prednisone, hypertension, who was recently diagnosed with influenza, on Tamiflu ER with complaint of nausea, vomiting, diarrhea, generalized weakness fever headache myalgias of 5 days.  In the ER she was found to have acute renal failure with BUN/creatinine 39/3.9, hyponatremia, hypokalemia of 2.8, anion gap metabolic acidosis.  She will is afebrile, hemodynamically stable.  Patient was admitted for further management. This morning she feels much improved.  No nausea or vomiting, able to tolerate breakfast.  But is still having diarrhea.  Acute kidney injury w BUN/creatinine 39/3.9, suspecting prerenal from her nausea vomiting diarrhea: BUN/creatinine down to 35/3.5.,  Will continue on IV fluid hydration, changed to 0.45 NS with 19M EQ bicarb at 125 mL/hr, repeat BMP later today and in the morning monitor urine output.  If creatinine not improving and/or worsening may need to discuss with nephrology.  Replete the potassium w recheck in afternoon. Continue on renally adjusted dose of Tamiflu. Continue on steroid.BP soft we will continue aggressive IV fluid hydration. Steroids dose has been increased x3 to 15 mg daily.

## 2018-03-21 DIAGNOSIS — J111 Influenza due to unidentified influenza virus with other respiratory manifestations: Secondary | ICD-10-CM

## 2018-03-21 DIAGNOSIS — D696 Thrombocytopenia, unspecified: Secondary | ICD-10-CM

## 2018-03-21 DIAGNOSIS — N179 Acute kidney failure, unspecified: Principal | ICD-10-CM

## 2018-03-21 DIAGNOSIS — R197 Diarrhea, unspecified: Secondary | ICD-10-CM

## 2018-03-21 DIAGNOSIS — R112 Nausea with vomiting, unspecified: Secondary | ICD-10-CM

## 2018-03-21 LAB — BASIC METABOLIC PANEL
ANION GAP: 8 (ref 5–15)
BUN: 13 mg/dL (ref 6–20)
CALCIUM: 8 mg/dL — AB (ref 8.9–10.3)
CO2: 26 mmol/L (ref 22–32)
Chloride: 109 mmol/L (ref 98–111)
Creatinine, Ser: 1.13 mg/dL — ABNORMAL HIGH (ref 0.44–1.00)
GFR, EST NON AFRICAN AMERICAN: 53 mL/min — AB (ref >60)
Glucose, Bld: 93 mg/dL (ref 70–99)
Potassium: 2.9 mmol/L — ABNORMAL LOW (ref 3.5–5.1)
Sodium: 143 mmol/L (ref 135–145)

## 2018-03-21 LAB — CBC
HCT: 29.7 % — ABNORMAL LOW (ref 36.0–46.0)
HEMOGLOBIN: 9.7 g/dL — AB (ref 12.0–15.0)
MCH: 29 pg (ref 26.0–34.0)
MCHC: 32.7 g/dL (ref 30.0–36.0)
MCV: 88.9 fL (ref 80.0–100.0)
NRBC: 0 % (ref 0.0–0.2)
PLATELETS: 205 10*3/uL (ref 150–400)
RBC: 3.34 MIL/uL — AB (ref 3.87–5.11)
RDW: 12.6 % (ref 11.5–15.5)
WBC: 4 10*3/uL (ref 4.0–10.5)

## 2018-03-21 MED ORDER — POTASSIUM CHLORIDE CRYS ER 20 MEQ PO TBCR
40.0000 meq | EXTENDED_RELEASE_TABLET | ORAL | Status: DC
  Administered 2018-03-21 (×2): 40 meq via ORAL
  Filled 2018-03-21 (×2): qty 2

## 2018-03-21 MED ORDER — MAGNESIUM SULFATE 2 GM/50ML IV SOLN
2.0000 g | Freq: Once | INTRAVENOUS | Status: AC
  Administered 2018-03-21: 2 g via INTRAVENOUS
  Filled 2018-03-21: qty 50

## 2018-03-21 NOTE — Progress Notes (Signed)
  PROGRESS NOTE  Madison Barajas EXH:[id number] DOB: [birth year] DOA: 03/19/2018 PCP: Renaye Rakers, MD  Brief Narrative: 57 year old woman PMH rheumatoid arthritis presented with 5-day history of nausea, vomiting, diarrhea, fatigue, myalgias.  Diagnosed with influenza several days prior to presentation and started on Tamiflu.  Assessment/Plan Acute kidney injury, secondary to nausea, vomiting, diarrhea, complicated by ibuprofen, telmisartan, hydrochlorothiazide. --Acute kidney injury resolved.  Urine output adequate.  Renal function appears to be normal.  Nausea, vomiting, diarrhea with associated abdominal pain, likely influenza related. --Nausea and vomiting resolved.  Diarrhea has slowed down.  Abdominal pain has resolved. --Supportive care.  Hypokalemia --Replete.  Also replete magnesium.  Influenza dx as outpatient --Complete Tamiflu   Normocytic anemia --Recheck CBC today  Thrombocytopenia --Likely secondary to influenza.  Recheck CBC today  Rheumatoid arthritis on methotrexate and prednisone as an outpatient. --Continue prednisone.  Resume methotrexate as an outpatient.   Improved.  Advance to regular diet.  If tolerates anticipate discharge this afternoon.  DVT prophylaxis: heparin Code Status: Full Family Communication: none Disposition Plan: home    Brendia Sacks, MD  [facility] Direct contact: [phone] --Via amion app OR  --[email or web address]; password [email or web address]  7PM-7AM contact night coverage as above 03/21/2018, 8:44 AM  LOS: 1 day   Consultants:    Procedures:    Antimicrobials:  Tamiflu  Interval history/Subjective: Afebrile, vital signs stable. Feels a lot better, no nausea or vomiting.  Diarrhea has slowed down, perhaps 3 times per day.  Tolerating liquids.  Would like to try solid diet.  Objective: Vitals:  Vitals:   03/20/18 2254 03/21/18 0515  BP: 98/60 (!) 106/52  Pulse: 63 (!) 52  Resp: 16 16  Temp: 98.9 F (37.2 C) 98.2  F (36.8 C)  SpO2: 98% 99%    Exam:  Constitutional:  . Appears calm and comfortable Eyes:  . pupils and irises appear normal ENMT:  . grossly normal hearing  . Lips appear normal . Tongue appears unremarkable Respiratory:  . CTA bilaterally, no w/r/r.  . Respiratory effort normal.  Cardiovascular:  . RRR, no m/r/g . No LE extremity edema   Abdomen:  . Soft, nontender, nondistended Psychiatric:  . Mental status o Mood, affect appropriate  I have personally reviewed the following:   Data: . Urine output 700+ multiple voids . Creatinine 3.92, 3.52, 2.08, 1.13 . Potassium 2.9 magnesium borderline yesterday 1.7 . CBC.  Today pending.  Yesterday platelets were 143, hemoglobin 10.3. Marland Kitchen Urinalysis was equivocal . I-STAT hCG was modestly elevated.  Unclear why this was checked in a patient PMH hysterectomy and age 35.  Follow-up as an outpatient. . Chest x-ray independently reviewed, no acute disease . EKG nonacute  Scheduled Meds: . folic acid  1 mg Oral Daily  . heparin  5,000 Units Subcutaneous Q8H  . oseltamivir  30 mg Oral Daily  . potassium chloride  40 mEq Oral Q4H  . predniSONE  15 mg Oral Q breakfast   Continuous Infusions: . magnesium sulfate 1 - 4 g bolus IVPB      Principal Problem:   AKI (acute kidney injury) (HCC) Active Problems:   RA (rheumatoid arthritis) (HCC)   Influenza   Nausea vomiting and diarrhea   Thrombocytopenia (HCC)   LOS: 1 day

## 2018-03-21 NOTE — Discharge Summary (Signed)
Physician Discharge Summary  Madison Barajas XYI:[id number] DOB: 1960-07-01 DOA: 03/19/2018  PCP: Renaye Rakers, MD  Admit date: 03/19/2018 Discharge date: 03/21/2018  Recommendations for Outpatient Follow-up:   Normocytic anemia --Stable.  Follow-up as an outpatient.  Follow-up Information    Renaye Rakers, MD. Schedule an appointment as soon as possible for a visit in 2 week(s).   Specialty:  Family Medicine Contact information: [address] [place] [address] [phone]            Discharge Diagnoses:  1. Acute kidney injury PRINCIPAL 2. Nausea, vomiting, diarrhea with associated abdominal pain, Hypokalemia 3. Influenza dx as outpatient 4. Normocytic anemia 5. Thrombocytopenia 6. Rheumatoid arthritis   Discharge Condition: improved Disposition: home  Diet recommendation: heart healthy  Filed Weights   03/19/18 2321  Weight: 63.5 kg    History of present illness:  57 year old woman PMH rheumatoid arthritis presented with 5-day history of nausea, vomiting, diarrhea, fatigue, myalgias.  Diagnosed with influenza several days prior to presentation and started on Tamiflu.  Hospital Course:  Patient was treated with aggressive IV fluids, ibuprofen telmisartan and hydrochlorothiazide were held.  Condition rapidly improved and renal function returned to near normal.  Nausea, vomiting resolved.  Diarrhea manageable.  Tolerating diet.  Stable for discharge home.  Acute kidney injury, secondary to nausea, vomiting, diarrhea, complicated by ibuprofen, telmisartan, hydrochlorothiazide. --Acute kidney injury resolved.  Urine output adequate.  Renal function appears to be normal.  Nausea, vomiting, diarrhea with associated abdominal pain, likely influenza related. --Nausea and vomiting resolved.  Diarrhea has slowed down.  Abdominal pain has resolved. --Supportive care.  Hypokalemia --Replete.  Also replete magnesium.  Influenza dx as  outpatient --Complete Tamiflu   Normocytic anemia --Stable.  Follow-up as an outpatient.  Thrombocytopenia --Resolved.  Rheumatoid arthritis on methotrexate and prednisone as an outpatient. --Continue prednisone.  Resume methotrexate as an outpatient.  Today's assessment: See progress note same day  Discharge Instructions  Discharge Instructions    Activity as tolerated - No restrictions   Complete by:  As directed    Diet - low sodium heart healthy   Complete by:  As directed    Discharge instructions   Complete by:  As directed    Call your physician or seek immediate medical attention for pain, fever, vomiting, diarrhea, poor appetite, decreased urination or worsening of condition.     Allergies as of 03/21/2018   No Known Allergies     Medication List    STOP taking these medications   ibuprofen 800 MG tablet Commonly known as:  ADVIL,MOTRIN     TAKE these medications   eszopiclone 2 MG Tabs tablet Commonly known as:  LUNESTA Take 2 mg by mouth at bedtime.   folic acid 1 MG tablet Commonly known as:  FOLVITE Take 1 mg by mouth daily.   methotrexate 2.5 MG tablet Take 15 mg by mouth once a week. On Friday   ondansetron 8 MG tablet Commonly known as:  ZOFRAN Take 8 mg by mouth every 8 (eight) hours as needed for nausea. for nausea   oseltamivir 75 MG capsule Commonly known as:  TAMIFLU Take 75 mg by mouth daily.   predniSONE 5 MG tablet Commonly known as:  DELTASONE Take 5 mg by mouth daily.   telmisartan-hydrochlorothiazide 40-12.5 MG tablet Commonly known as:  MICARDIS HCT Take 1 tablet by mouth daily.      No Known Allergies  The results of significant diagnostics from this hospitalization (including imaging,  microbiology, ancillary and laboratory) are listed below for reference.    Significant Diagnostic Studies: Dg Chest 2 View  Result Date: 03/20/2018 CLINICAL DATA:  Diagnosed with flu 2 days ago. Nausea, vomiting, diarrhea, and  body aches. History of hypertension. EXAM: CHEST - 2 VIEW COMPARISON:  August 29, 2016 FINDINGS: The heart size and mediastinal contours are within normal limits. Both lungs are clear. The visualized skeletal structures are unremarkable. IMPRESSION: No active cardiopulmonary disease. Electronically Signed   By: Gerome Sam III M.D   On: 03/20/2018 01:44    Labs: Basic Metabolic Panel: Recent Labs  Lab 03/19/18 2326 03/20/18 0247 03/20/18 1418 03/21/18 0544  NA 132* 134* 140 143  K 2.8* 3.1* 3.2* 2.9*  CL 97* 106 112* 109  CO2 19* 17* 21* 26  GLUCOSE 97 82 129* 93  BUN 39* 35* 26* 13  CREATININE 3.92* 3.52* 2.08* 1.13*  CALCIUM 8.6* 7.2* 8.0* 8.0*  MG  --   --  1.7  --    Liver Function Tests: Recent Labs  Lab 03/19/18 2326  AST 23  ALT 27  ALKPHOS 42  BILITOT 0.8  PROT 6.8  ALBUMIN 3.9   Recent Labs  Lab 03/19/18 2326  LIPASE 38   CBC: Recent Labs  Lab 03/19/18 2326 03/20/18 0247 03/21/18 0848  WBC 6.6 5.2 4.0  HGB 12.9 10.3* 9.7*  HCT 38.7 30.9* 29.7*  MCV 88.2 89.3 88.9  PLT 223 143* 205    Principal Problem:   AKI (acute kidney injury) (HCC) Active Problems:   RA (rheumatoid arthritis) (HCC)   Influenza   Nausea vomiting and diarrhea   Thrombocytopenia (HCC)   Time coordinating discharge: 35 minutes  Signed:  Brendia Sacks, MD [facility] 03/21/2018, 2:27 PM

## 2019-08-07 ENCOUNTER — Ambulatory Visit: Payer: BLUE CROSS/BLUE SHIELD

## 2019-08-19 ENCOUNTER — Ambulatory Visit: Payer: BC Managed Care – PPO | Attending: Internal Medicine

## 2019-08-19 DIAGNOSIS — Z23 Encounter for immunization: Secondary | ICD-10-CM

## 2019-08-19 NOTE — Progress Notes (Signed)
   Covid-19 Vaccination Clinic  Name:  Madison Barajas    MRN: [id number] DOB: Aug 21, 1960  08/19/2019  [name] was observed post Covid-19 immunization for 15 minutes without incident. She was provided with Vaccine Information Sheet and instruction to access the V-Safe system.   [name] was instructed to call 911 with any severe reactions post vaccine: Marland Kitchen Difficulty breathing  . Swelling of face and throat  . A fast heartbeat  . A bad rash all over body  . Dizziness and weakness   Immunizations Administered    Name Date Dose VIS Date Route   Pfizer COVID-19 Vaccine 08/19/2019  4:07 PM 0.3 mL 04/16/2019 Intramuscular   Manufacturer: [employer], [employer]   Lot: [id number]   NDC: [address]-6

## 2019-09-20 ENCOUNTER — Ambulatory Visit: Payer: BC Managed Care – PPO | Attending: Internal Medicine

## 2020-02-09 IMAGING — CT CT CHEST HIGH RESOLUTION W/O CM
1 of 4 series · 14 of 33 positions shown, 18 images · non-contrast
Comparison: None.

CLINICAL DATA: Evaluate for interstitial lung disease.

EXAM:
CT CHEST WITHOUT CONTRAST
TECHNIQUE: Multidetector CT imaging of the chest was performed following the
standard protocol without intravenous contrast. High resolution
imaging of the lungs, as well as inspiratory and expiratory imaging,
was performed.

[Series 2: chest · axial · 0.66mm/px · z∈[-225,+3]mm · 14 of 128 slices shown, 18 images]
[im 7/128  mediastinal]
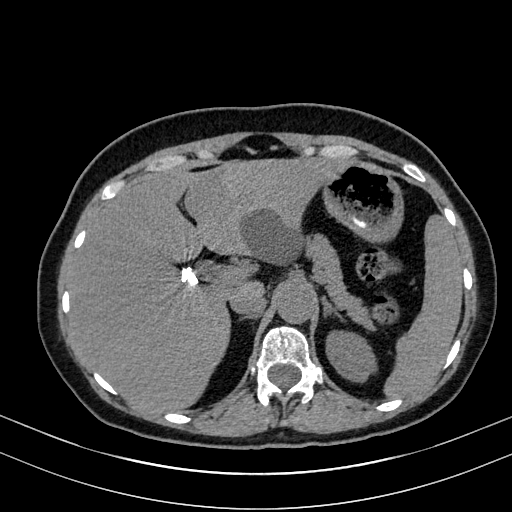
[im 7/128  lung]
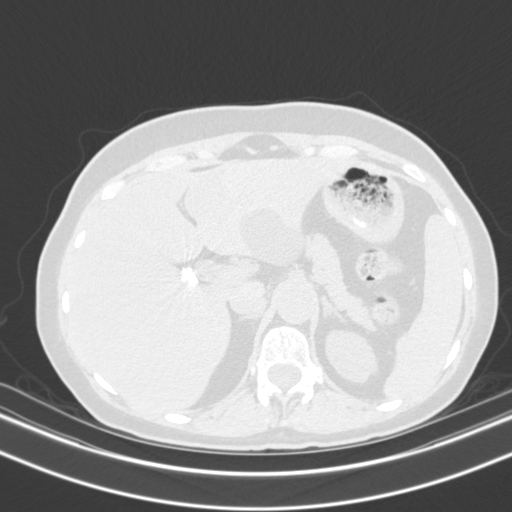
[im 19/128  lung]
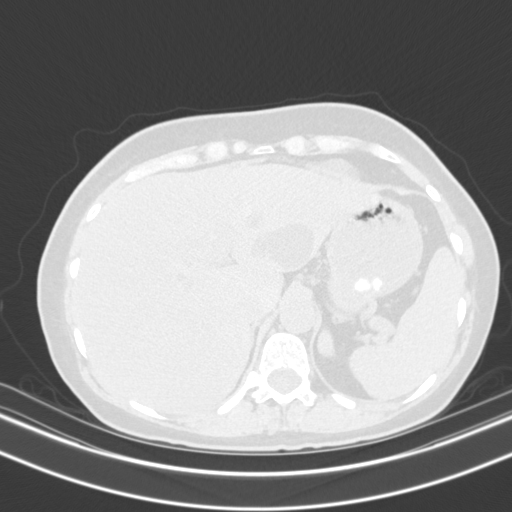
[im 25/128  lung]
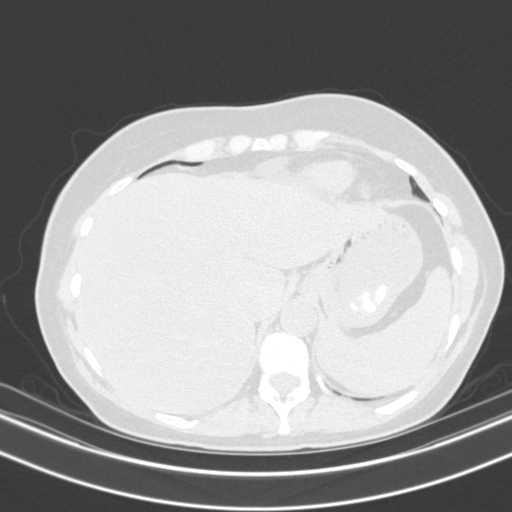
[im 37/128  lung]
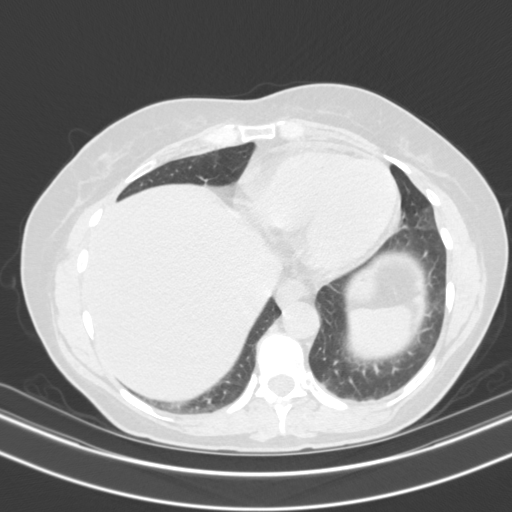
[im 49/128  mediastinal]
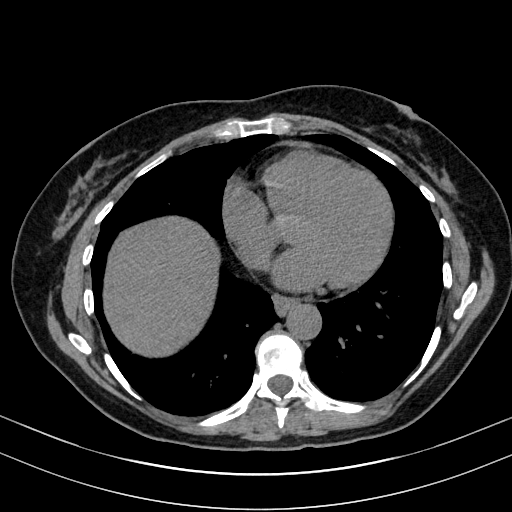
[im 49/128  lung]
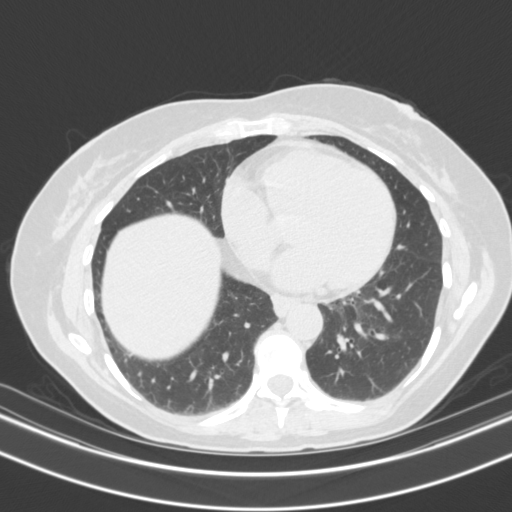
[im 55/128  lung]
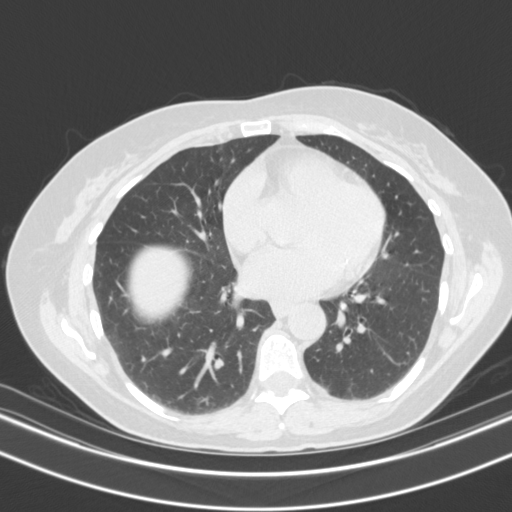
[im 61/128  lung]
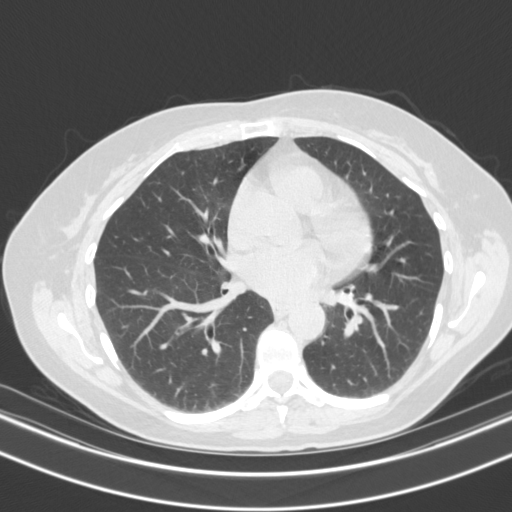
[im 64/128  lung]
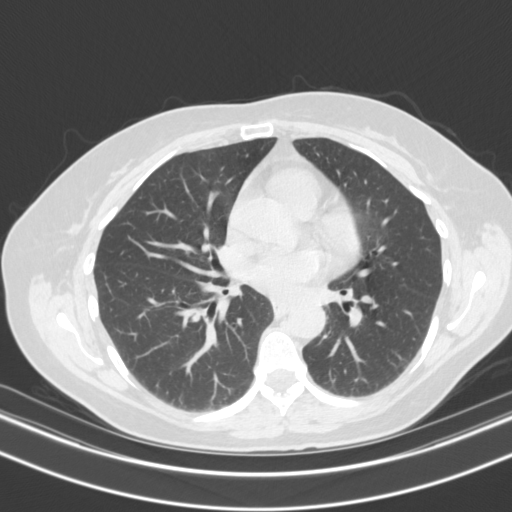
[im 73/128  mediastinal]
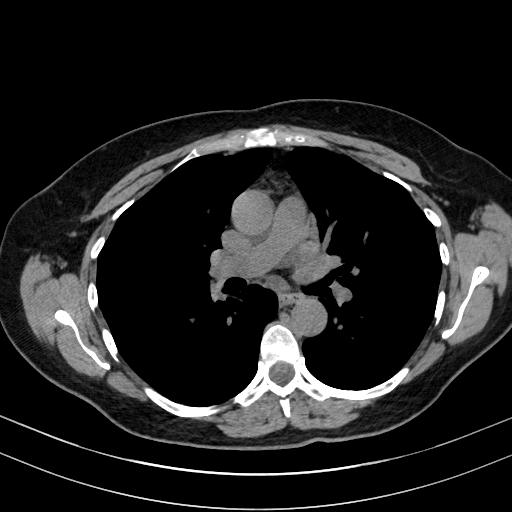
[im 73/128  lung]
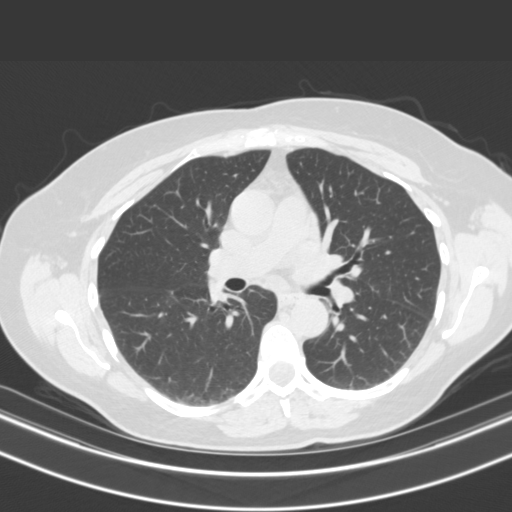
[im 79/128  lung]
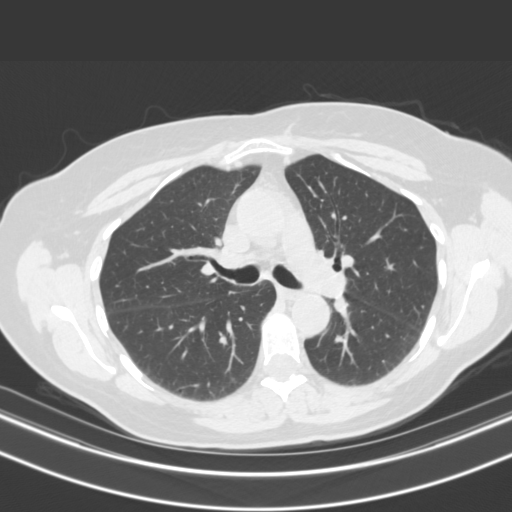
[im 91/128  lung]
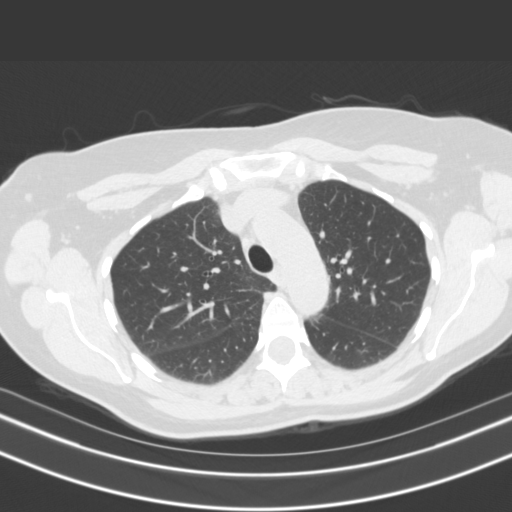
[im 103/128  lung]
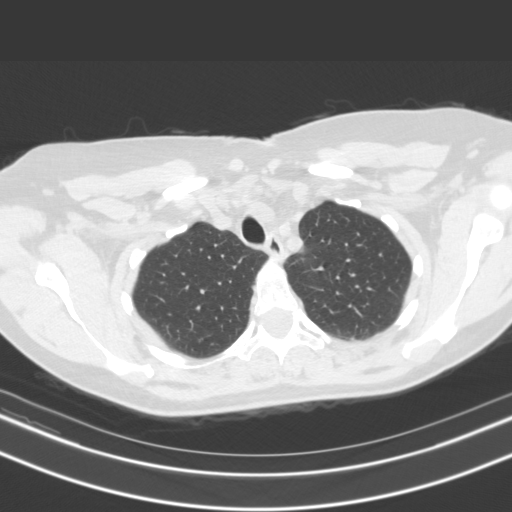
[im 109/128  mediastinal]
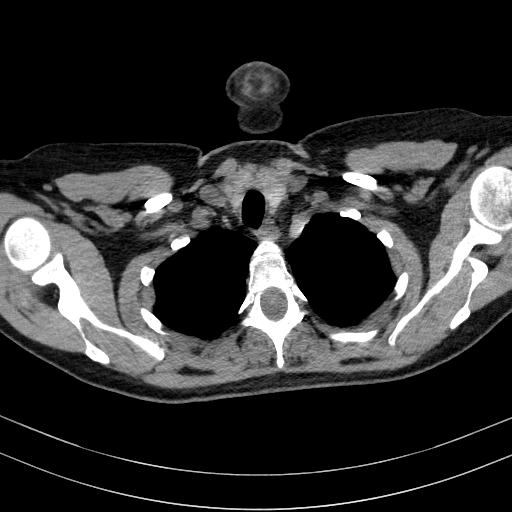
[im 109/128  lung]
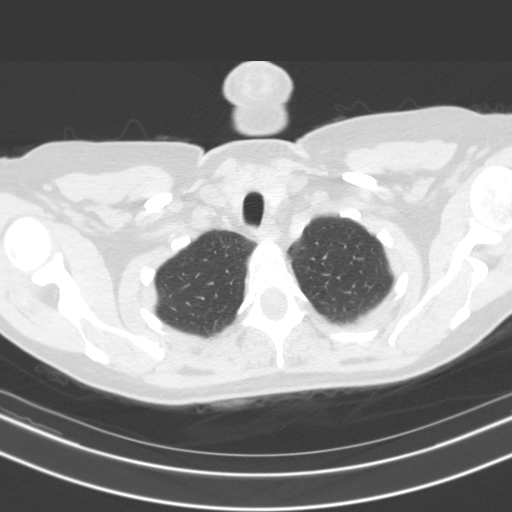
[im 121/128  lung]
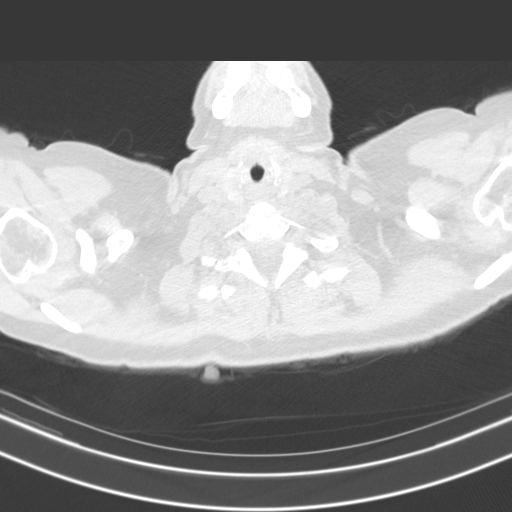

[14 of 33 positions shown; findings below may reference images not displayed]

FINDINGS: Cardiovascular: Normal heart size. Trace pericardial
effusion/thickening. Great vessels are normal in course and caliber.

Mediastinum/Nodes: No discrete thyroid nodules. Unremarkable
esophagus. No pathologically enlarged axillary, mediastinal or hilar
lymph nodes, noting limited sensitivity for the detection of hilar
adenopathy on this noncontrast study.

Lungs/Pleura: No pneumothorax. No pleural effusion. No acute
consolidative airspace disease, lung masses or significant pulmonary
nodules. Clustered calcified granulomas in the posterior left upper
lobe. Subcentimeter calcified peribronchial central left upper lobe
granuloma. No significant air trapping on the expiration sequence.
There is very mild patchy subpleural reticulation and ground-glass
attenuation in both lungs with a dependent basilar gradient. No
significant regions of traction bronchiectasis, parenchymal banding,
architectural distortion or frank honeycombing.

Upper abdomen: Small hiatal hernia. Simple 5.7 cm posterior left
liver lobe cyst. Focal ill-defined low-attenuation in the
subcapsular lateral segment left liver lobe adjacent to the
intersegmental fissure (series 2/image 125). Cholecystectomy.

Musculoskeletal: No aggressive appearing focal osseous lesions. Mild
thoracic spondylosis.
IMPRESSION: 1. Nonspecific very mild patchy subpleural reticulation and
ground-glass attenuation in the dependent basilar lungs, without
significant traction bronchiectasis or frank honeycombing. These
findings may simply represent hypoventilatory change. The
possibility of an interstitial lung disease such as mild nonspecific
interstitial pneumonia (NSIP) or early usual interstitial pneumonia
(UIP) cannot be excluded. Consider a follow-up high-resolution chest
CT study in 12 months to assess temporal pattern stability, as
clinically warranted. Prone imaging may be requested on the
follow-up CT study.
2. Focal ill-defined low-attenuation in the subcapsular lateral
segment left liver lobe adjacent to the intersegmental fissure. This
finding is indeterminate and may represent focal fat. MRI abdomen
without and with IV contrast is recommended to exclude a liver
neoplasm.
3. Trace pericardial effusion/thickening.
4. Small hiatal hernia.

## 2020-08-05 IMAGING — CR DG CHEST 2V
2 series · 2 of 2 positions shown · non-contrast
Comparison: August 29, 2016

CLINICAL DATA: Diagnosed with flu 2 days ago. Nausea, vomiting,
diarrhea, and body aches. History of hypertension.

EXAM:
CHEST - 2 VIEW

[chest lat]
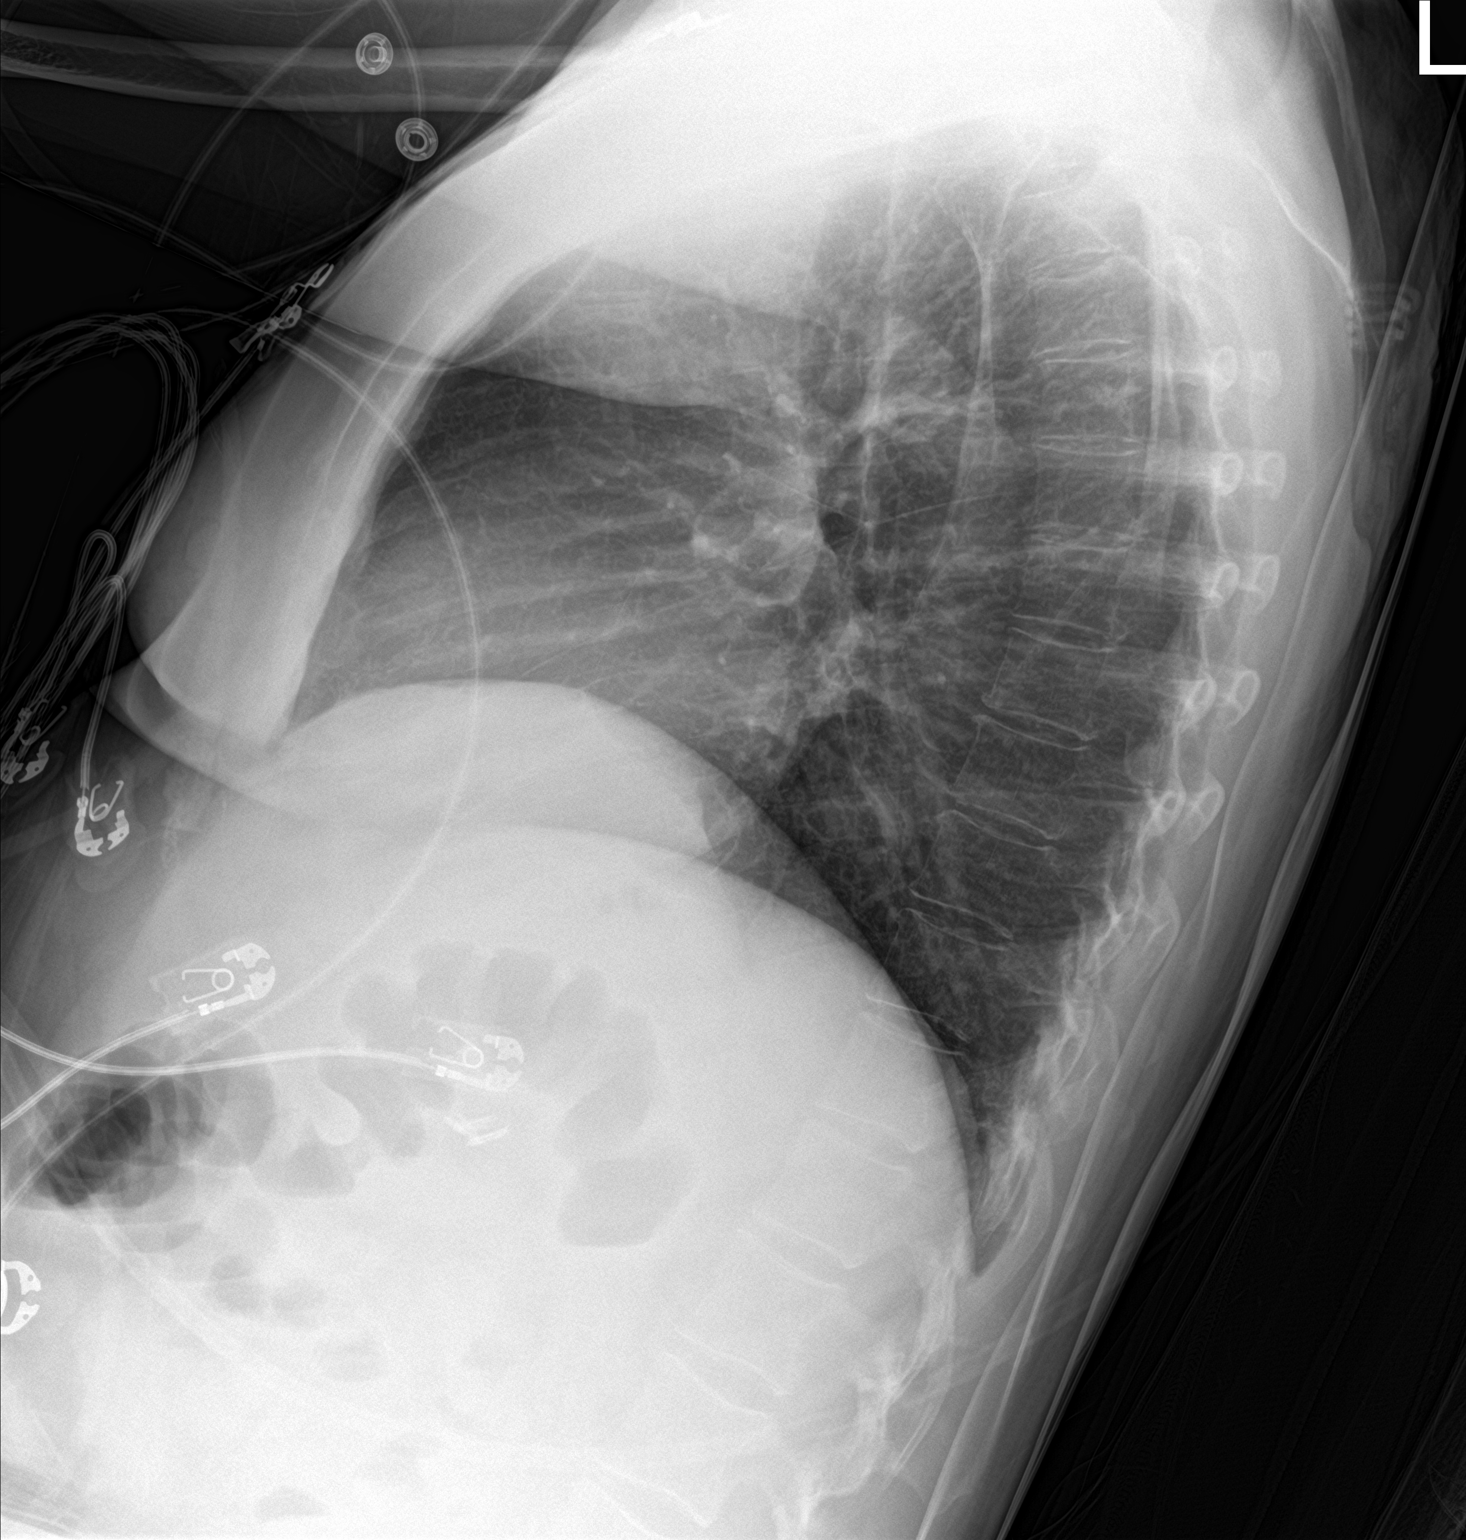

[chest ap]
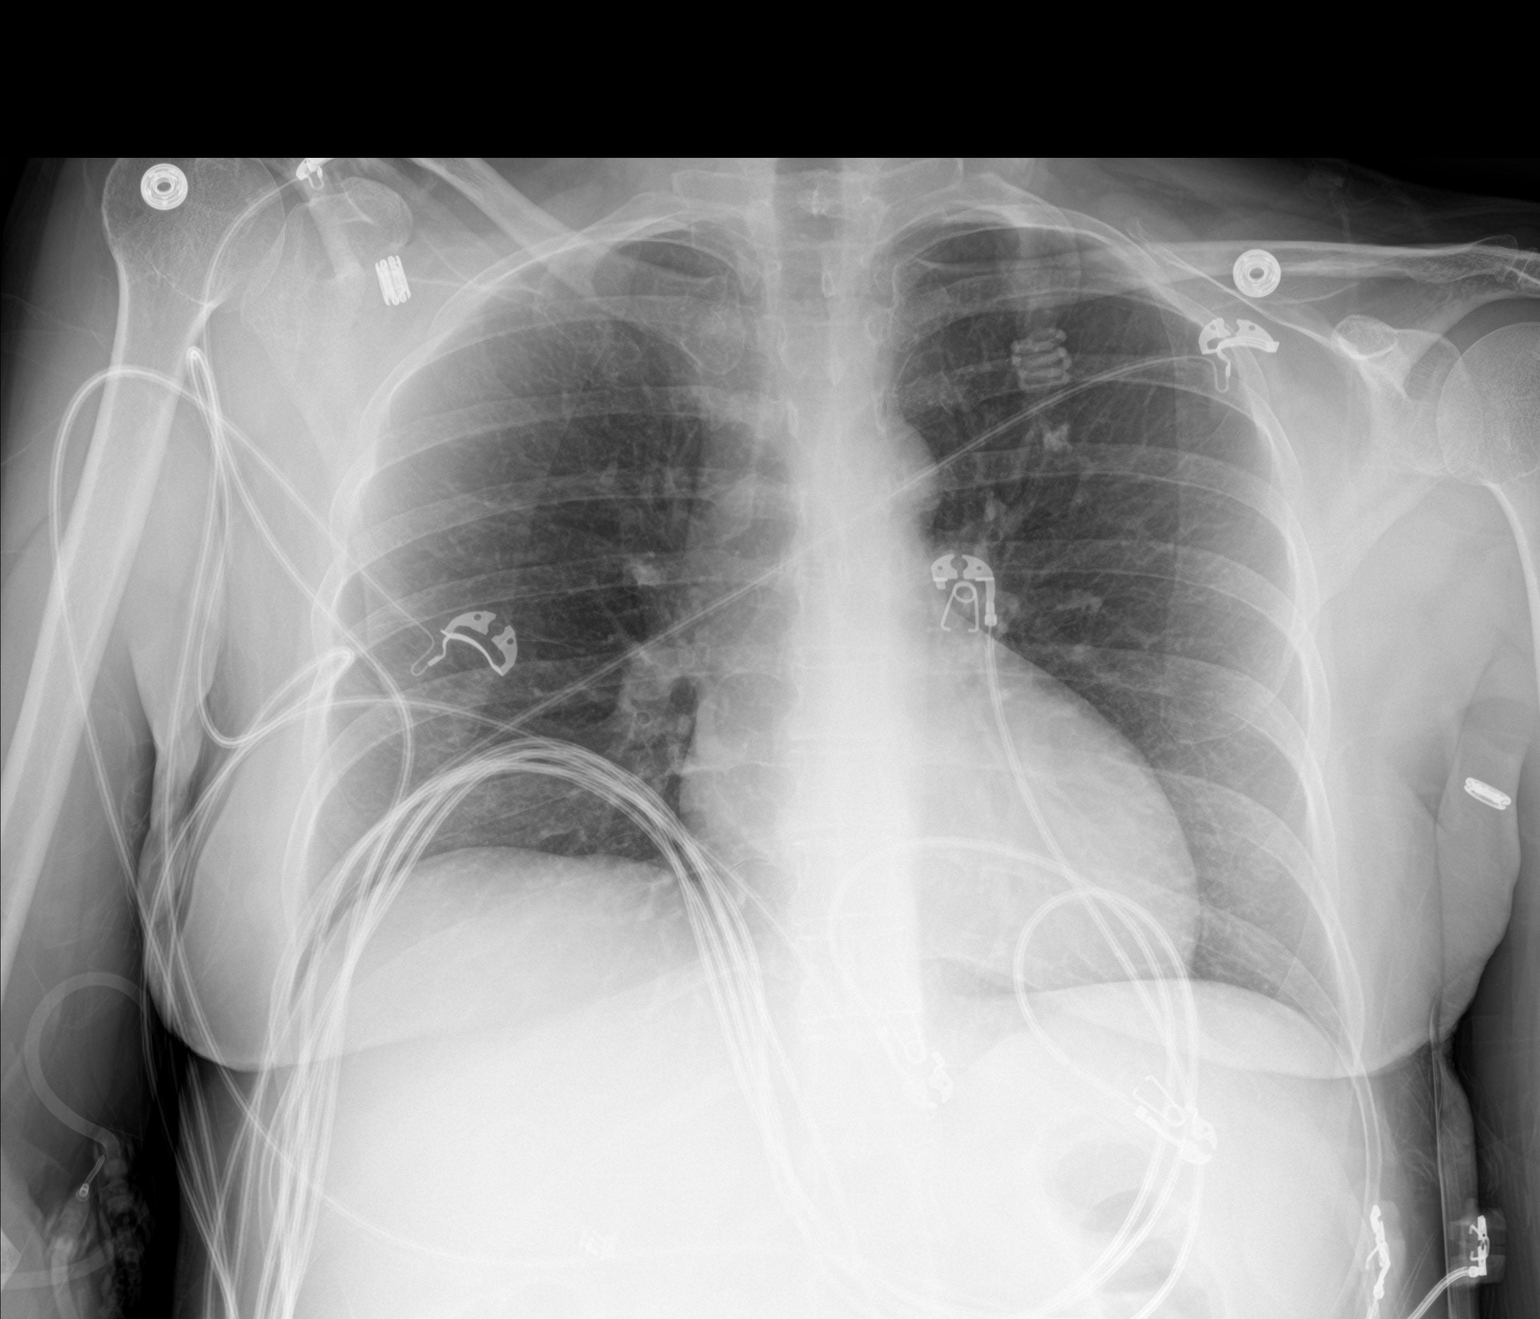

[2 of 2 positions shown; findings below may reference images not displayed]

FINDINGS: The heart size and mediastinal contours are within normal limits.
Both lungs are clear. The visualized skeletal structures are
unremarkable.
IMPRESSION: No active cardiopulmonary disease.

## 2022-11-14 ENCOUNTER — Other Ambulatory Visit: Payer: Self-pay | Admitting: Rheumatology

## 2022-11-14 DIAGNOSIS — M0510 Rheumatoid lung disease with rheumatoid arthritis of unspecified site: Secondary | ICD-10-CM

## 2022-11-15 ENCOUNTER — Encounter: Payer: Self-pay | Admitting: Rheumatology

## 2022-12-06 ENCOUNTER — Ambulatory Visit
Admission: RE | Admit: 2022-12-06 | Discharge: 2022-12-06 | Disposition: A | Discharge status: Home / Self Care | Payer: BC Managed Care – PPO | Source: Ambulatory Visit | Attending: Rheumatology | Admitting: Rheumatology

## 2022-12-06 DIAGNOSIS — M0510 Rheumatoid lung disease with rheumatoid arthritis of unspecified site: Secondary | ICD-10-CM

## 2023-09-22 ENCOUNTER — Telehealth: Payer: Self-pay | Admitting: Pharmacy Technician

## 2023-09-22 ENCOUNTER — Other Ambulatory Visit: Payer: Self-pay | Admitting: Family Medicine

## 2023-09-22 DIAGNOSIS — M810 Age-related osteoporosis without current pathological fracture: Secondary | ICD-10-CM | POA: Insufficient documentation

## 2023-09-22 NOTE — Telephone Encounter (Signed)
 Auth Submission: NO AUTH NEEDED Site of care: Site of care: [facility] Payer: [employer] [place]  Medication & CPT/J Code(s) submitted: Reclast (Zolendronic acid) [id number] Route of submission (phone, fax, portal):  Phone # Fax # Auth type: Buy/Bill PB Units/visits requested: X1 DOSE Reference number: [id number] Approval from: 5/19/5 - 05/05/24

## 2023-09-30 ENCOUNTER — Telehealth: Payer: Self-pay

## 2023-09-30 NOTE — Telephone Encounter (Signed)
 Called ordering provider Dr. Alston Jerry office at [phone] and spoke with Mar Semen, receptionist. Relayed that none of the phone numbers listed in the patient's chart are working numbers, including the number listed for the patient's emergency contact. Ernestine confirmed that they had the same phone numbers listed for the patient and her emergency contact, and did not have any additional contact information. Ernestine stated that she would let Dr. Nida Barrow know that we have been attempting to contact the patient regarding scheduling her Reclast infusion, but have been unsuccessful.  Lendel Quant, RN

## 2023-10-31 ENCOUNTER — Ambulatory Visit (INDEPENDENT_AMBULATORY_CARE_PROVIDER_SITE_OTHER)

## 2023-10-31 VITALS — BP 105/63 | HR 50 | Temp 98.0°F | Resp 18 | Ht 64.0 in | Wt 133.2 lb

## 2023-10-31 DIAGNOSIS — M810 Age-related osteoporosis without current pathological fracture: Secondary | ICD-10-CM

## 2023-10-31 MED ORDER — ACETAMINOPHEN 325 MG PO TABS
650.0000 mg | ORAL_TABLET | Freq: Once | ORAL | Status: AC
  Administered 2023-10-31: 650 mg via ORAL
  Filled 2023-10-31: qty 2

## 2023-10-31 MED ORDER — ZOLEDRONIC ACID 5 MG/100ML IV SOLN
5.0000 mg | Freq: Once | INTRAVENOUS | Status: AC
  Administered 2023-10-31: 5 mg via INTRAVENOUS
  Filled 2023-10-31: qty 100

## 2023-10-31 MED ORDER — DIPHENHYDRAMINE HCL 25 MG PO CAPS
25.0000 mg | ORAL_CAPSULE | Freq: Once | ORAL | Status: AC
  Administered 2023-10-31: 25 mg via ORAL
  Filled 2023-10-31: qty 1

## 2023-10-31 MED ORDER — SODIUM CHLORIDE 0.9 % IV SOLN: INTRAVENOUS | Status: DC

## 2023-10-31 NOTE — Progress Notes (Signed)
 Diagnosis: Osteoporosis  Provider:  Lonna Coder MD  Procedure: IV Infusion  IV Type: Peripheral, IV Location: L Antecubital  Reclast (Zolendronic Acid), Dose: 5 mg  Infusion Start Time: 1400  Infusion Stop Time: 1432  Post Infusion IV Care: Observation period completed and Peripheral IV Discontinued  Discharge: Condition: Good, Destination: Home . AVS Provided  Performed by:  Leita FORBES Miles, LPN

## 2024-01-07 ENCOUNTER — Encounter: Payer: Self-pay | Admitting: Pulmonary Disease

## 2024-01-07 ENCOUNTER — Ambulatory Visit: Admitting: Pulmonary Disease

## 2024-01-07 VITALS — BP 112/60 | HR 59 | Temp 98.4°F | Ht 64.0 in | Wt 138.0 lb

## 2024-01-07 DIAGNOSIS — R918 Other nonspecific abnormal finding of lung field: Secondary | ICD-10-CM

## 2024-01-07 DIAGNOSIS — M069 Rheumatoid arthritis, unspecified: Secondary | ICD-10-CM

## 2024-01-07 DIAGNOSIS — I10 Essential (primary) hypertension: Secondary | ICD-10-CM

## 2024-01-07 DIAGNOSIS — J849 Interstitial pulmonary disease, unspecified: Secondary | ICD-10-CM

## 2024-01-07 NOTE — Progress Notes (Signed)
 Madison Barajas    [id number]    1960/05/30  Primary Care Physician:Bland, Kennieth, MD  Referring Physician: Leni Marjory MATSU, MD [address] [place],  [place] [address]  Chief complaint: Consult for ILD evaluation, history of rheumatoid arthritis on methotrexate  HPI: 63 y.o. who  has a past medical history of Hypertension and Rheumatoid arthritis (HCC).  Discussed the use of AI scribe software for clinical note transcription with the patient, who gave verbal consent to proceed.  History of Present Illness Madison Barajas is a 63 year old female with rheumatoid arthritis who presents for evaluation of lung involvement. She was referred by Dr. Agnes for evaluation of lung involvement due to rheumatoid arthritis.  Pulmonary symptoms and imaging findings - No current respiratory symptoms, including shortness of breath - Occasional cough when throat is dry - Two prior lung scans in 2019 and 2024 showed a small area of calcium buildup in the left lung - No history of smoking; tried smoking in the past but discontinued due to intolerance - No significant occupational exposures to chemicals or construction materials  Rheumatoid arthritis and immunosuppressive therapy - Diagnosed with rheumatoid arthritis in 2018 or 2019.  Follows with Dr. 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  No feather pillows or comforters No h/o chemo/XRT/amiodarone/macrodantin/MTX  No exposure to asbestos, silica or other organic allergens  Smoking history: Non-smoker Travel history: No significant travel history Family  history: Father had COPD.  He was a smoker.   Outpatient Encounter Medications as of 01/07/2024  Medication Sig   folic acid  (FOLVITE ) 1 MG tablet Take 1 mg by mouth daily.   methotrexate 2.5 MG tablet Take 15 mg by mouth once a week. On Friday   ondansetron  (ZOFRAN ) 8 MG tablet Take 8 mg by mouth every 8 (eight) hours as needed for nausea. for nausea   eszopiclone (LUNESTA) 2 MG TABS tablet Take 2 mg by mouth at bedtime.    oseltamivir  (TAMIFLU ) 75 MG capsule Take 75 mg by mouth daily.   predniSONE  (DELTASONE ) 5 MG tablet Take 5 mg by mouth daily. (Patient not taking: Reported on 01/07/2024)   telmisartan-hydrochlorothiazide (MICARDIS HCT) 40-12.5 MG tablet Take 1 tablet by mouth daily.   No facility-administered encounter medications on file as of 01/07/2024.     Physical Exam: Today's Vitals   01/07/24 0949  BP: 112/60  Pulse: (!) 59  Temp: 98.4 F (36.9 C)  TempSrc: Oral  SpO2: 100%  Weight: 138 lb (62.6 kg)  Height: 5' 4 (1.626 m)   Body mass index is 23.69 kg/m.  Physical Exam VITALS: BP- 112/60 GEN: No acute distress. CV: Regular rate and rhythm, no murmurs. LUNGS: Clear to auscultation bilaterally, normal respiratory effort. SKIN JOINTS: Warm and dry, no rash.    Data Reviewed: Imaging: CT high-resolution 09/23/2017-nonspecific subpleural reticulation in the basilar lungs.  Coarse calcification in the left upper lobe CT chest 12/12/2022-visualized lungs appear clear.  Coarse calcification in the left upper lobe is stable and appears benign I had reviewed the images personally.  PFTs: 10/09/2017 FVC 3.13 [89%], FEV1 2.61 [95%], F/F83, TLC 5.09 [97%], DLCO 18.39 [  71%] Mild diffusion defect  Labs: Assessment & Plan Rheumatoid arthritis with pulmonary evaluation Rheumatoid arthritis diagnosed in 2018 or 2019, currently well-controlled with methotrexate 15 mg weekly. No current use of prednisone . Pulmonary evaluation due to potential lung involvement from rheumatoid  arthritis.  PFTs in the past showed mild diffusion defect though previous CT scans in 2019 and 2024 showed no interstitial lung disease, only benign calcification in the left lung. No symptoms of lung involvement such as shortness of breath, only occasional cough due to dry throat. Lungs sound clear on examination. - Order high-resolution chest CT scan to evaluate for interstitial lung disease. - Schedule lung function test to reassess lung capacity. - Plan follow-up visit in six months for reevaluation.  Benign pulmonary calcification, left lung Calcification in the left lung noted on previous scans, likely due to past infection. Appears benign and not a cause for concern.  Essential hypertension Hypertension managed with Micardis. Blood pressure today is 112/60, indicating good control.  Recommendations: High-res CT, PFTs  Lonna Coder MD [facility] 01/07/2024, 9:50 AM  CC: Leni Marjory MATSU, MD

## 2024-01-07 NOTE — Patient Instructions (Signed)
  VISIT SUMMARY: Today, you were seen for an evaluation of potential lung involvement due to your rheumatoid arthritis. You have no current respiratory symptoms, and your rheumatoid arthritis is well-controlled with your current medication. We discussed your previous lung scans and your history of hypertension.  YOUR PLAN: -RHEUMATOID ARTHRITIS WITH PULMONARY EVALUATION: Rheumatoid arthritis is an autoimmune disease that can sometimes affect the lungs. Although you have no current lung symptoms, we will order a high-resolution chest CT scan and a lung function test to ensure there is no hidden lung involvement. We will follow up in six months to review the results and reassess your condition.  -BENIGN PULMONARY CALCIFICATION, LEFT LUNG: The small area of calcium buildup in your left lung, seen on previous scans, is likely from a past infection and is not a cause for concern.  -ESSENTIAL HYPERTENSION: Hypertension, or high blood pressure, is being well-managed with your current medication, Micardis. Your blood pressure today was 112/60, which is a good reading.  INSTRUCTIONS: Please complete the high-resolution chest CT scan and the lung function test as soon as possible. We will schedule a follow-up visit in six months to review the results and reassess your condition.

## 2024-01-09 ENCOUNTER — Ambulatory Visit
Admission: RE | Admit: 2024-01-09 | Discharge: 2024-01-09 | Disposition: A | Discharge status: Home / Self Care | Source: Ambulatory Visit | Attending: Pulmonary Disease | Admitting: Pulmonary Disease

## 2024-01-09 DIAGNOSIS — J849 Interstitial pulmonary disease, unspecified: Secondary | ICD-10-CM

## 2024-01-09 DIAGNOSIS — M069 Rheumatoid arthritis, unspecified: Secondary | ICD-10-CM

## 2024-01-14 ENCOUNTER — Ambulatory Visit: Payer: Self-pay | Admitting: Pulmonary Disease

## 2024-01-14 DIAGNOSIS — K769 Liver disease, unspecified: Secondary | ICD-10-CM

## 2024-01-20 ENCOUNTER — Ambulatory Visit (HOSPITAL_COMMUNITY)

## 2024-01-29 ENCOUNTER — Ambulatory Visit
Admission: RE | Admit: 2024-01-29 | Discharge: 2024-01-29 | Disposition: A | Discharge status: Home / Self Care | Source: Ambulatory Visit | Attending: Pulmonary Disease

## 2024-01-29 DIAGNOSIS — K769 Liver disease, unspecified: Secondary | ICD-10-CM

## 2024-01-29 MED ORDER — GADOPICLENOL 0.5 MMOL/ML IV SOLN
7.0000 mL | Freq: Once | INTRAVENOUS | Status: AC | PRN
  Administered 2024-01-29: 7 mL via INTRAVENOUS

## 2024-02-03 ENCOUNTER — Ambulatory Visit: Payer: Self-pay | Admitting: Pulmonary Disease

## 2024-02-22 ENCOUNTER — Emergency Department (HOSPITAL_BASED_OUTPATIENT_CLINIC_OR_DEPARTMENT_OTHER): Admission: EM | Admit: 2024-02-22 | Discharge: 2024-02-22 | Disposition: A | Discharge status: Home / Self Care

## 2024-02-22 ENCOUNTER — Emergency Department (HOSPITAL_BASED_OUTPATIENT_CLINIC_OR_DEPARTMENT_OTHER)

## 2024-02-22 ENCOUNTER — Encounter (HOSPITAL_BASED_OUTPATIENT_CLINIC_OR_DEPARTMENT_OTHER): Payer: Self-pay

## 2024-02-22 ENCOUNTER — Other Ambulatory Visit: Payer: Self-pay

## 2024-02-22 DIAGNOSIS — I959 Hypotension, unspecified: Secondary | ICD-10-CM | POA: Diagnosis not present

## 2024-02-22 DIAGNOSIS — R1110 Vomiting, unspecified: Secondary | ICD-10-CM | POA: Diagnosis present

## 2024-02-22 DIAGNOSIS — E860 Dehydration: Secondary | ICD-10-CM | POA: Insufficient documentation

## 2024-02-22 DIAGNOSIS — E876 Hypokalemia: Secondary | ICD-10-CM | POA: Diagnosis not present

## 2024-02-22 DIAGNOSIS — R197 Diarrhea, unspecified: Secondary | ICD-10-CM | POA: Insufficient documentation

## 2024-02-22 LAB — COMPREHENSIVE METABOLIC PANEL WITH GFR
ALT: 13 U/L (ref 0–44)
AST: 24 U/L (ref 15–41)
Albumin: 4.5 g/dL (ref 3.5–5.0)
Alkaline Phosphatase: 46 U/L (ref 38–126)
Anion gap: 20 — ABNORMAL HIGH (ref 5–15)
BUN: 17 mg/dL (ref 8–23)
CO2: 20 mmol/L — ABNORMAL LOW (ref 22–32)
Calcium: 8.9 mg/dL (ref 8.9–10.3)
Chloride: 96 mmol/L — ABNORMAL LOW (ref 98–111)
Creatinine, Ser: 1.41 mg/dL — ABNORMAL HIGH (ref 0.44–1.00)
GFR, Estimated: 42 mL/min — ABNORMAL LOW (ref >60)
Glucose, Bld: 135 mg/dL — ABNORMAL HIGH (ref 70–99)
Potassium: 2.6 mmol/L — CL (ref 3.5–5.1)
Sodium: 136 mmol/L (ref 135–145)
Total Bilirubin: 1.2 mg/dL (ref 0.0–1.2)
Total Protein: 7.3 g/dL (ref 6.5–8.1)

## 2024-02-22 LAB — URINALYSIS, ROUTINE W REFLEX MICROSCOPIC
Bilirubin Urine: NEGATIVE
Glucose, UA: NEGATIVE mg/dL
Leukocytes,Ua: NEGATIVE
Nitrite: NEGATIVE
Protein, ur: 30 mg/dL — AB
Specific Gravity, Urine: 1.015 (ref 1.005–1.030)
pH: 5.5 (ref 5.0–8.0)

## 2024-02-22 LAB — I-STAT VENOUS BLOOD GAS, ED
Acid-Base Excess: 0 mmol/L (ref 0.0–2.0)
Bicarbonate: 23.9 mmol/L (ref 20.0–28.0)
Calcium, Ion: 1.02 mmol/L — ABNORMAL LOW (ref 1.15–1.40)
HCT: 31 % — ABNORMAL LOW (ref 36.0–46.0)
Hemoglobin: 10.5 g/dL — ABNORMAL LOW (ref 12.0–15.0)
O2 Saturation: 50 %
Patient temperature: 98.2
Potassium: 2.7 mmol/L — CL (ref 3.5–5.1)
Sodium: 134 mmol/L — ABNORMAL LOW (ref 135–145)
TCO2: 25 mmol/L (ref 22–32)
pCO2, Ven: 34.9 mmHg — ABNORMAL LOW (ref 44–60)
pH, Ven: 7.443 — ABNORMAL HIGH (ref 7.25–7.43)
pO2, Ven: 25 mmHg — CL (ref 32–45)

## 2024-02-22 LAB — RESP PANEL BY RT-PCR (RSV, FLU A&B, COVID)  RVPGX2
Influenza A by PCR: NEGATIVE
Influenza B by PCR: NEGATIVE
Resp Syncytial Virus by PCR: NEGATIVE
SARS Coronavirus 2 by RT PCR: NEGATIVE

## 2024-02-22 LAB — CBG MONITORING, ED: Glucose-Capillary: 147 mg/dL — ABNORMAL HIGH (ref 70–99)

## 2024-02-22 LAB — CBC
HCT: 37 % (ref 36.0–46.0)
Hemoglobin: 13.4 g/dL (ref 12.0–15.0)
MCH: 30.2 pg (ref 26.0–34.0)
MCHC: 36.2 g/dL — ABNORMAL HIGH (ref 30.0–36.0)
MCV: 83.5 fL (ref 80.0–100.0)
Platelets: 211 K/uL (ref 150–400)
RBC: 4.43 MIL/uL (ref 3.87–5.11)
RDW: 12.6 % (ref 11.5–15.5)
WBC: 9.2 K/uL (ref 4.0–10.5)
nRBC: 0 % (ref 0.0–0.2)

## 2024-02-22 LAB — LACTIC ACID, PLASMA: Lactic Acid, Venous: 1.8 mmol/L (ref 0.5–1.9)

## 2024-02-22 LAB — TROPONIN T, HIGH SENSITIVITY
Troponin T High Sensitivity: 15 ng/L (ref 0–19)
Troponin T High Sensitivity: <15 ng/L (ref 0–19)

## 2024-02-22 LAB — LIPASE, BLOOD: Lipase: 191 U/L — ABNORMAL HIGH (ref 11–51)

## 2024-02-22 MED ORDER — ONDANSETRON HCL 4 MG/2ML IJ SOLN
4.0000 mg | Freq: Once | INTRAMUSCULAR | Status: AC
  Administered 2024-02-22: 4 mg via INTRAVENOUS
  Filled 2024-02-22: qty 2

## 2024-02-22 MED ORDER — POTASSIUM CHLORIDE CRYS ER 20 MEQ PO TBCR
40.0000 meq | EXTENDED_RELEASE_TABLET | Freq: Once | ORAL | Status: AC
Start: 2024-02-22 — End: 2024-02-22
  Administered 2024-02-22: 40 meq via ORAL
  Filled 2024-02-22: qty 2

## 2024-02-22 MED ORDER — LACTATED RINGERS IV BOLUS
1000.0000 mL | Freq: Once | INTRAVENOUS | Status: AC
  Administered 2024-02-22: 1000 mL via INTRAVENOUS

## 2024-02-22 MED ORDER — ONDANSETRON HCL 4 MG PO TABS: 4.0000 mg | ORAL_TABLET | Freq: Three times a day (TID) | ORAL | 0 refills | Status: AC | PRN

## 2024-02-22 MED ORDER — IOHEXOL 350 MG/ML SOLN
100.0000 mL | Freq: Once | INTRAVENOUS | Status: AC | PRN
  Administered 2024-02-22: 100 mL via INTRAVENOUS

## 2024-02-22 MED ORDER — POTASSIUM CHLORIDE CRYS ER 20 MEQ PO TBCR: 20.0000 meq | EXTENDED_RELEASE_TABLET | Freq: Two times a day (BID) | ORAL | 0 refills | Status: AC

## 2024-02-22 NOTE — ED Notes (Signed)
 Dr Gennaro at bedside

## 2024-02-22 NOTE — ED Provider Notes (Signed)
 [place] EMERGENCY DEPARTMENT AT [facility] Provider Note   CSN: [id number] Arrival date & time: 02/22/24  0004     Patient presents with: Fatigue   Madison Barajas is a 63 y.o. female.   63 year old female presents for evaluation of fatigue and not feeling well.  States has been on since Thursday.  States she has had a little bit of diarrhea and vomiting until earlier today.  States she has not had anything to eat or drink today.  Denies any abdominal pain or congestion.  Denies any fevers or any other symptoms or concerns at this time.        Prior to Admission medications   Medication Sig Start Date End Date Taking? Authorizing Provider  ondansetron  (ZOFRAN ) 4 MG tablet Take 1 tablet (4 mg total) by mouth every 8 (eight) hours as needed for up to 4 days. 02/22/24 02/26/24 Yes Dejanay Wamboldt L, DO  potassium chloride  SA (KLOR-CON  M) 20 MEQ tablet Take 1 tablet (20 mEq total) by mouth 2 (two) times daily for 4 days. 02/22/24 02/26/24 Yes Tasheka Houseman L, DO  eszopiclone (LUNESTA) 2 MG TABS tablet Take 2 mg by mouth at bedtime.  02/20/18   [provider]  folic acid  (FOLVITE ) 1 MG tablet Take 1 mg by mouth daily. 02/19/18   [provider]  methotrexate 2.5 MG tablet Take 15 mg by mouth once a week. On Friday 02/26/18   [provider]  ondansetron  (ZOFRAN ) 8 MG tablet Take 8 mg by mouth every 8 (eight) hours as needed for nausea. for nausea 03/18/18   [provider]  oseltamivir  (TAMIFLU ) 75 MG capsule Take 75 mg by mouth daily. Patient not taking: Reported on 01/07/2024 03/18/18   [provider]  predniSONE  (DELTASONE ) 5 MG tablet Take 5 mg by mouth daily. Patient not taking: Reported on 01/07/2024 02/23/18   [provider]  telmisartan-hydrochlorothiazide (MICARDIS HCT) 40-12.5 MG tablet Take 1 tablet by mouth daily. 02/15/18   [provider]    Allergies: Patient has no known allergies.    Review  of Systems  Constitutional:  Positive for fatigue. Negative for chills and fever.  HENT:  Negative for ear pain and sore throat.   Eyes:  Negative for pain and visual disturbance.  Respiratory:  Negative for cough and shortness of breath.   Cardiovascular:  Negative for chest pain and palpitations.  Gastrointestinal:  Positive for diarrhea and vomiting. Negative for abdominal pain.  Genitourinary:  Negative for dysuria and hematuria.  Musculoskeletal:  Negative for arthralgias and back pain.  Skin:  Negative for color change and rash.  Neurological:  Negative for seizures and syncope.  All other systems reviewed and are negative.   Updated Vital Signs BP 95/63 (BP Location: Right Arm)   Pulse 65   Temp 98 F (36.7 C) (Oral)   Resp 13   SpO2 98%   Physical Exam Vitals and nursing note reviewed.  Constitutional:      General: She is not in acute distress.    Appearance: Normal appearance. She is well-developed. She is ill-appearing.  HENT:     Head: Normocephalic and atraumatic.  Eyes:     Conjunctiva/sclera: Conjunctivae normal.  Cardiovascular:     Rate and Rhythm: Normal rate and regular rhythm.     Heart sounds: No murmur heard. Pulmonary:     Effort: Pulmonary effort is normal. No respiratory distress.     Breath sounds: Normal breath sounds. No stridor. No wheezing  or rhonchi.  Abdominal:     General: There is no distension.     Palpations: Abdomen is soft. There is no mass.     Tenderness: There is no abdominal tenderness.     Hernia: No hernia is present.  Musculoskeletal:        General: No swelling.     Cervical back: Neck supple.  Skin:    General: Skin is warm and dry.     Capillary Refill: Capillary refill takes less than 2 seconds.  Neurological:     Mental Status: She is alert.  Psychiatric:        Mood and Affect: Mood normal.     (all labs ordered are listed, but only abnormal results are displayed) Labs Reviewed  COMPREHENSIVE METABOLIC PANEL  WITH GFR - Abnormal; Notable for the following components:      Result Value   Potassium 2.6 (*)    Chloride 96 (*)    CO2 20 (*)    Glucose, Bld 135 (*)    Creatinine, Ser 1.41 (*)    GFR, Estimated 42 (*)    Anion gap 20 (*)    All other components within normal limits  CBC - Abnormal; Notable for the following components:   MCHC 36.2 (*)    All other components within normal limits  URINALYSIS, ROUTINE W REFLEX MICROSCOPIC - Abnormal; Notable for the following components:   APPearance HAZY (*)    Hgb urine dipstick SMALL (*)    Ketones, ur TRACE (*)    Protein, ur 30 (*)    Bacteria, UA RARE (*)    All other components within normal limits  LIPASE, BLOOD - Abnormal; Notable for the following components:   Lipase 191 (*)    All other components within normal limits  CBG MONITORING, ED - Abnormal; Notable for the following components:   Glucose-Capillary 147 (*)    All other components within normal limits  I-STAT VENOUS BLOOD GAS, ED - Abnormal; Notable for the following components:   pH, Ven 7.443 (*)    pCO2, Ven 34.9 (*)    pO2, Ven 25 (*)    Sodium 134 (*)    Potassium 2.7 (*)    Calcium, Ion 1.02 (*)    HCT 31.0 (*)    Hemoglobin 10.5 (*)    All other components within normal limits  RESP PANEL BY RT-PCR (RSV, FLU A&B, COVID)  RVPGX2  LACTIC ACID, PLASMA  LACTIC ACID, PLASMA  TROPONIN T, HIGH SENSITIVITY  TROPONIN T, HIGH SENSITIVITY    EKG: EKG Interpretation Date/Time:  Sunday February 22 2024 00:23:58 EDT Ventricular Rate:  96 PR Interval:  150 QRS Duration:  102 QT Interval:  412 QTC Calculation: 521 R Axis:   59  Text Interpretation: Sinus rhythm Ventricular premature complex Prolonged QTc interval Confirmed by Gennaro Bouchard ([id number]) on 02/22/2024 12:35:23 AM  Radiology: CT Angio Chest/Abd/Pel for Dissection W and/or Wo Contrast Result Date: 02/22/2024 EXAM: CTA CHEST, ABDOMEN AND PELVIS WITH AND WITHOUT CONTRAST 02/22/2024 02:30:13 AM TECHNIQUE:  Noncontrast CT of the chest was initially obtained. CTA of the chest was performed with the administration of intravenous contrast. CTA of the abdomen and pelvis was performed with the administration of intravenous contrast. Multiplanar reformatted images are provided for review. MIP images are provided for review. Automated exposure control, iterative reconstruction, and/or weight based adjustment of the mA/kV was utilized to reduce the radiation dose to as low as reasonably achievable. COMPARISON: MRI abdomen without contrast 01/29/2024,  chest CT resolution 01/09/2024, chest CT no contrast 12/06/2022 and high resolution chest CT 09/23/2017. CLINICAL HISTORY: abd pain, chest pain, low BP. History of rheumatoid arthritis. FINDINGS: VASCULATURE: AORTA: The thoracic aorta is normal in caliber and course with only trace calcific plaque in the isthmus. The abdominal aorta demonstrates minimal scattered calcific plaques without evidence of aortic aneurysm, stenosis, or dissection. PULMONARY ARTERIES: The pulmonary arteries are also fairly well opacified and do not show evidence of embolus. The pulmonary veins are non-dilated. GREAT VESSELS OF AORTIC ARCH: The great vessels branch normally and are all widely patent with small amount of calcific plaque in the distal left subclavian artery only. No dissection. CELIAC TRUNK: No acute finding. No occlusion or significant stenosis. SUPERIOR MESENTERIC ARTERY: No acute finding. No occlusion or significant stenosis. INFERIOR MESENTERIC ARTERY: No acute finding. No occlusion or significant stenosis. RENAL ARTERIES: There are 2 arteries supplying each kidney. All 4 are widely patent. ILIAC ARTERIES: No acute finding. No occlusion or significant stenosis. CHEST: MEDIASTINUM: No mediastinal lymphadenopathy. The cardiac size is normal. There is a small chronic pericardial effusion. Scattered single vessel calcific plaques in the coronary artery. LUNGS AND PLEURA: Mild chronic  elevation of the right hemidiaphragm. Scattered linear scarring is again noted in the bases. Mild posterior atelectasis. There are branching coarse calcifications posteriorly in the upper lobe consistent with chronic calcified peripheral airway impactions; this is not seen elsewhere. There is no evidence of pulmonary fibrosis, nodules, or active infiltrates. There is no pleural effusion, thickening, or pneumothorax. THORACIC BONES AND SOFT TISSUES: There is osteopenia and mild kyphosis of the thoracic spine, slight dextroscoliosis with spondylosis and multilevel degenerative disc disease in the thoracic spine. No acute soft tissue abnormality. ABDOMEN AND PELVIS: LIVER: Mild hepatic steatosis is again noted. No mass is seen. A cyst is again noted posteriorly in segment 3 measuring 5.3 x 3.9 cm and 15 Hounsfield units. 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 GALLBLADDER AND BILE DUCTS: Gallbladder is unremarkable. No biliary ductal dilatation. SPLEEN: The spleen is mildly enlarged, measuring 15 cm AP, without focal abnormality. PANCREAS: The pancreas is unremarkable. ADRENAL GLANDS: Bilateral adrenal glands demonstrate no acute abnormality. KIDNEYS, URETERS AND BLADDER: No stones in the kidneys or ureters. No hydronephrosis. No perinephric or periureteral stranding. The bladder is contracted and grossly unremarkable. GI AND BOWEL: The gastric wall is increasingly thickened in the antrum with mucosal thickening continuing down the descending duodenum and associated inflammatory changes, findings consistent with gastroduodenitis or peptic ulcer disease. There is non-dilated fluid filling of the small bowel in the lower abdomen, which is nonspecific but may be seen with nonspecific enteritis or ileus . There is no mesenteric inflammatory change or small bowel obstruction. The appendix is not seen in this patient. There is  fluid throughout the colon without wall thickening or inflammation. REPRODUCTIVE: The uterus is absent. No adnexal mass is seen. PERITONEUM AND RETROPERITONEUM: No ascites or free air. LYMPH NODES: No lymphadenopathy. ABDOMINAL BONES AND SOFT TISSUES: Mild degenerative changes in the lumbar spine. No suspicious regional bone lesions. No acute soft tissue abnormality. IMPRESSION: 1. No evidence of pulmonary embolism. 2. Small amounts of aortic calcific plaques, otherwise negative CTA exam with no aortic aneurysm, stenosis, or dissection. 3. Single vessel calcific plaques in the lad coronary artery. Small chronic pericardial effusion. 4. Findings consistent with gastroduodenitis or peptic ulcer disease. 5. Nonspecific enteritis  versus ileus without evidence of small bowel obstruction. 6. Fluid throughout the colon but no wall thickening or inflammatory change. 7. Mild splenomegaly without focal abnormality. 8. Benign liver findings. Electronically signed by: Francis Quam MD 02/22/2024 03:23 AM EDT RP Workstation: [id number]     Procedures   Medications Ordered in the ED  lactated ringers bolus 1,000 mL (0 mLs Intravenous Stopped 02/22/24 0147)  ondansetron  (ZOFRAN ) injection 4 mg (4 mg Intravenous Given 02/22/24 0040)  lactated ringers bolus 1,000 mL (0 mLs Intravenous Stopped 02/22/24 0430)  potassium chloride  SA (KLOR-CON  M) CR tablet 40 mEq (40 mEq Oral Given 02/22/24 0209)  iohexol (OMNIPAQUE) 350 MG/ML injection 100 mL (100 mLs Intravenous Contrast Given 02/22/24 0229)  ondansetron  (ZOFRAN ) injection 4 mg (4 mg Intravenous Given 02/22/24 0338)  lactated ringers bolus 1,000 mL (0 mLs Intravenous Stopped 02/22/24 0454)                                    Medical Decision Making Cardiac monitor interpretation: Sinus rhythm, no ectopy  Patient here for generalized weakness with low blood pressure.  She is dehydrated on exam was given 3 L of IV fluid with improvement in her blood pressure.  She  is found to have a likely duodenitis/gastroenteritis.  Labs are fairly unremarkable except for an anion gap metabolic acidosis and hypokalemia.  Potassium was replaced.  Patient able to tolerate p.o. without difficulty has not had vomiting since earlier.  I offered and recommended admission but she declined and would like to go home.  Is able to eat without difficulty and vitals are otherwise stable.  Family at bedside will take her home.  I will give her prescription for some Zofran  as well as a few more days of potassium.  She was advised close follow-up with primary care and otherwise return to the ER for new or worsening symptoms.  Also advised to increase her fluid intake and eat adequately as well as some electrolyte containing solutions as well.  Advised return for new or worsening symptoms.  She feels comfortable being discharged home.  Problems Addressed: Dehydration: acute illness or injury Hypokalemia: acute illness or injury Hypotension, unspecified hypotension type: acute illness or injury Vomiting and diarrhea: acute illness or injury  Amount and/or Complexity of Data Reviewed External Data Reviewed: notes.    Details: Prior outpatient records reviewed and patient with history of RA Labs: ordered. Decision-making details documented in ED Course.    Details: Ordered and reviewed by me and lab work fairly unremarkable except for hypokalemia anion gap metabolic acidosis Radiology: ordered and independent interpretation performed. Decision-making details documented in ED Course.    Details: Ordered and reviewed by me CT abdomen pelvis: Shows no acute abnormality in the abdomen except for some gastroenteritis, duodenitis ECG/medicine tests: ordered and independent interpretation performed. Decision-making details documented in ED Course.    Details: Ordered and interpreted by me in the absence of cardiology and shows a sinus rhythm, no STEMI or acute changes otherwise  Risk OTC  drugs. Prescription drug management. Drug therapy requiring intensive monitoring for toxicity. Decision regarding hospitalization. Risk Details: CRITICAL CARE Performed by: Duwaine LITTIE Fusi   Total critical care time: 35 minutes  Critical care time was exclusive of separately billable procedures and treating other patients.  Critical care was necessary to treat or prevent imminent or life-threatening deterioration.  Critical care was time spent personally by me on the following  activities: development of treatment plan with patient and/or surrogate as well as nursing, discussions with consultants, evaluation of patient's response to treatment, examination of patient, obtaining history from patient or surrogate, ordering and performing treatments and interventions, ordering and review of laboratory studies, ordering and review of radiographic studies, pulse oximetry and re-evaluation of patient's condition.   Critical Care Total time providing critical care: 35 minutes     Final diagnoses:  Hypokalemia  Dehydration  Hypotension, unspecified hypotension type  Vomiting and diarrhea    ED Discharge Orders          Ordered    ondansetron  (ZOFRAN ) 4 MG tablet  Every 8 hours PRN        02/22/24 0441    potassium chloride  SA (KLOR-CON  M) 20 MEQ tablet  2 times daily        02/22/24 0441               Gennaro Duwaine CROME, DO 02/22/24 [phone]

## 2024-02-22 NOTE — Discharge Instructions (Signed)
 Take your potassium for the next few days as prescribed.  Take your Zofran  as needed for nausea and vomiting.  You can pick up Imodium, it is over-the-counter and take it as needed for diarrhea.  Drink lots of fluids and electrolyte containing solutions.  Eat a bland diet but do try to eat even if you do not have an appetite.  Return to the ER for new or worsening symptoms, otherwise follow-up with your primary care doctor in 1 to 2 weeks.

## 2024-02-22 NOTE — ED Triage Notes (Signed)
 Pt states she has had generalized weakness and fatigue x 1 day. Pt is hypotensive in triage and taken to room immediately. Pt denies CP/SOB and reports only pain is generalized body aches. She had 1 episode of vomiting earlier today.

## 2024-05-27 NOTE — Progress Notes (Unsigned)
 Pft and f/u visit; needs scheduling. Routed to assurant.

## 2024-08-30 ENCOUNTER — Ambulatory Visit: Admitting: Pulmonary Disease

## 2024-08-30 ENCOUNTER — Encounter
